# Patient Record
Sex: Male | Born: 1995
Health system: Southern US, Community
[De-identification: ages and names within clinical notes are randomized; demographics above are authoritative.]

## PROBLEM LIST (undated history)

## (undated) DIAGNOSIS — J45909 Unspecified asthma, uncomplicated: Secondary | ICD-10-CM

## (undated) HISTORY — PX: INGUINAL HERNIA REPAIR: SUR1180

---

## 2013-11-02 ENCOUNTER — Emergency Department: Payer: Self-pay | Admitting: Emergency Medicine

## 2014-02-20 ENCOUNTER — Emergency Department: Payer: Self-pay | Admitting: Emergency Medicine

## 2014-07-20 ENCOUNTER — Emergency Department: Payer: Medicaid Other

## 2014-07-20 ENCOUNTER — Encounter: Payer: Self-pay | Admitting: Emergency Medicine

## 2014-07-20 ENCOUNTER — Emergency Department
Admission: EM | Admit: 2014-07-20 | Discharge: 2014-07-20 | Disposition: A | Discharge status: Home / Self Care | Payer: Medicaid Other | Attending: Student | Admitting: Student

## 2014-07-20 DIAGNOSIS — Y998 Other external cause status: Secondary | ICD-10-CM | POA: Insufficient documentation

## 2014-07-20 DIAGNOSIS — S8001XA Contusion of right knee, initial encounter: Secondary | ICD-10-CM | POA: Diagnosis not present

## 2014-07-20 DIAGNOSIS — Z720 Tobacco use: Secondary | ICD-10-CM | POA: Diagnosis not present

## 2014-07-20 DIAGNOSIS — Y9389 Activity, other specified: Secondary | ICD-10-CM | POA: Insufficient documentation

## 2014-07-20 DIAGNOSIS — M25561 Pain in right knee: Secondary | ICD-10-CM

## 2014-07-20 DIAGNOSIS — Y92410 Unspecified street and highway as the place of occurrence of the external cause: Secondary | ICD-10-CM | POA: Insufficient documentation

## 2014-07-20 DIAGNOSIS — S8991XA Unspecified injury of right lower leg, initial encounter: Secondary | ICD-10-CM | POA: Diagnosis present

## 2014-07-20 HISTORY — DX: Unspecified asthma, uncomplicated: J45.909

## 2014-07-20 MED ORDER — KETOROLAC TROMETHAMINE 60 MG/2ML IM SOLN
INTRAMUSCULAR | Status: AC
  Administered 2014-07-20: 60 mg via INTRAMUSCULAR
  Filled 2014-07-20: qty 2

## 2014-07-20 MED ORDER — HYDROCODONE-ACETAMINOPHEN 5-325 MG PO TABS: 2.0000 | ORAL_TABLET | Freq: Four times a day (QID) | ORAL | Status: DC | PRN

## 2014-07-20 MED ORDER — IBUPROFEN 800 MG PO TABS: 800.0000 mg | ORAL_TABLET | Freq: Three times a day (TID) | ORAL | Status: DC | PRN

## 2014-07-20 MED ORDER — KETOROLAC TROMETHAMINE 30 MG/ML IJ SOLN: 60.0000 mg | Freq: Once | INTRAMUSCULAR | Status: AC
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
## 2014-12-01 ENCOUNTER — Encounter: Payer: Self-pay | Admitting: *Deleted

## 2014-12-01 DIAGNOSIS — Z720 Tobacco use: Secondary | ICD-10-CM | POA: Diagnosis not present

## 2014-12-01 DIAGNOSIS — J45909 Unspecified asthma, uncomplicated: Secondary | ICD-10-CM | POA: Diagnosis not present

## 2014-12-01 DIAGNOSIS — J029 Acute pharyngitis, unspecified: Secondary | ICD-10-CM | POA: Diagnosis present

## 2014-12-01 DIAGNOSIS — H9203 Otalgia, bilateral: Secondary | ICD-10-CM | POA: Insufficient documentation

## 2014-12-01 LAB — POCT RAPID STREP A: Streptococcus, Group A Screen (Direct): NEGATIVE

## 2014-12-01 NOTE — ED Notes (Signed)
Pt reports onset of bilateral ear pain and throat pain for about 2 days. Denies fevers.

## 2014-12-02 ENCOUNTER — Emergency Department
Admission: EM | Admit: 2014-12-02 | Discharge: 2014-12-02 | Disposition: A | Discharge status: Home / Self Care | Payer: Medicaid Other | Attending: Emergency Medicine | Admitting: Emergency Medicine

## 2014-12-02 DIAGNOSIS — J029 Acute pharyngitis, unspecified: Secondary | ICD-10-CM

## 2014-12-02 MED ORDER — MAGIC MOUTHWASH: 5.0000 mL | Freq: Three times a day (TID) | ORAL | Status: AC | PRN

## 2014-12-02 MED ORDER — AMOXICILLIN 500 MG PO CAPS: 500.0000 mg | ORAL_CAPSULE | Freq: Three times a day (TID) | ORAL | Status: DC

## 2014-12-02 MED ORDER — AMOXICILLIN 500 MG PO CAPS
500.0000 mg | ORAL_CAPSULE | Freq: Once | ORAL | Status: AC
  Administered 2014-12-02: 500 mg via ORAL
  Filled 2014-12-02: qty 1

## 2014-12-02 MED ORDER — MAGIC MOUTHWASH
10.0000 mL | Freq: Once | ORAL | Status: AC
  Administered 2014-12-02: 10 mL via ORAL
  Filled 2014-12-02: qty 10

## 2014-12-02 NOTE — ED Provider Notes (Signed)
[facility] Emergency Department Provider Note  ____________________________________________  Time seen: Approximately 1:10 AM  I have reviewed the triage vital signs and the nursing notes.   HISTORY  Chief Complaint Sore Throat    HPI Aycen Porreca Pesta is a 19 y.o. male who presents to the ED from home with a 2 day history ofsore throat and bilateral ear pain. Denies sick contacts. Denies fevers, chills, chest pain, shortness of breath, coughing, abdominal pain, nausea, vomiting, diarrhea, headache. Denies drainage from ears. Complains of sharp pain in throat, especially on swallowing. Noted "lump" on right side of neck today.   Past Medical History  Diagnosis Date  . Asthma     There are no active problems to display for this patient.   Past Surgical History  Procedure Laterality Date  . Inguinal hernia repair Right     Current Outpatient Rx  Name  Route  Sig  Dispense  Refill  . HYDROcodone-acetaminophen (NORCO) 5-325 MG per tablet   Oral   Take 2 tablets by mouth every 6 (six) hours as needed for moderate pain.   12 tablet   0   . ibuprofen (ADVIL,MOTRIN) 800 MG tablet   Oral   Take 1 tablet (800 mg total) by mouth every 8 (eight) hours as needed.   30 tablet   0     Allergies Review of patient's allergies indicates no known allergies.  No family history on file.  Social History Social History  Substance Use Topics  . Smoking status: Current Every Day Smoker    Types: Cigarettes  . Smokeless tobacco: Never Used  . Alcohol Use: Yes    Review of Systems Constitutional: No fever/chills Eyes: No visual changes. ENT: Positive sore throat. Positive bilateral ear pain. Cardiovascular: Denies chest pain. Respiratory: Denies shortness of breath. Gastrointestinal: No abdominal pain.  No nausea, no vomiting.  No diarrhea.  No constipation. Genitourinary: Negative for dysuria. Musculoskeletal: Negative for back pain. Skin:  Negative for rash. Neurological: Negative for headaches, focal weakness or numbness.  10-point ROS otherwise negative.  ____________________________________________   PHYSICAL EXAM:  VITAL SIGNS: ED Triage Vitals  Enc Vitals Group     BP 12/01/14 2330 146/92 mmHg     Pulse Rate 12/01/14 2330 72     Resp 12/01/14 2330 16     Temp 12/01/14 2330 98.7 F (37.1 C)     Temp Source 12/01/14 2330 Oral     SpO2 12/01/14 2330 100 %     Weight 12/01/14 2330 160 lb (72.576 kg)     Height 12/01/14 2330 5\' 10"  (1.778 m)     Head Cir --      Peak Flow --      Pain Score 12/01/14 2331 6     Pain Loc --      Pain Edu? --      Excl. in GC? --     Constitutional: Alert and oriented. Well appearing and in no acute distress. Eyes: Conjunctivae are normal. PERRL. EOMI. Head: Atraumatic. Ears: Bilateral TMs with mild fluid; otherwise within normal limits. Nose: No congestion/rhinnorhea. Mouth/Throat: Mucous membranes are moist.  Oropharynx erythematous. There is no tonsillar swelling or peritonsillar abscess. There is no hoarse or muffled voice. There is no drooling. Neck: No stridor.   Hematological/Lymphatic/Immunilogical: Small, mobile, palpable lymph node right anterior cervical neck.. Cardiovascular: Normal rate, regular rhythm. Grossly normal heart sounds.  Good peripheral circulation. Respiratory: Normal respiratory effort.  No retractions. Lungs CTAB. Gastrointestinal: Soft and nontender.  No distention. No abdominal bruits. No CVA tenderness. Musculoskeletal: No lower extremity tenderness nor edema.  No joint effusions. Neurologic:  Normal speech and language. No gross focal neurologic deficits are appreciated. No gait instability. Skin:  Skin is warm, dry and intact. No rash noted. Psychiatric: Mood and affect are normal. Speech and behavior are normal.  ____________________________________________   LABS (all labs ordered are listed, but only abnormal results are  displayed)  Labs Reviewed  CULTURE, GROUP A STREP ([facility] ONLY)  POCT RAPID STREP A   ____________________________________________  EKG  None ____________________________________________  RADIOLOGY  None ____________________________________________   PROCEDURES  Procedure(s) performed: None  Critical Care performed: No  ____________________________________________   INITIAL IMPRESSION / ASSESSMENT AND PLAN / ED COURSE  Pertinent labs & imaging results that were available during my care of the patient were reviewed by me and considered in my medical decision making (see chart for details).  19 year old male who presents with pharyngitis. Despite negative rapid strep test, throat is very erythematous and I will treat empirically with amoxicillin. Magic mouthwash for discomfort. Strict return precautions given. Patient and spouse verbalize understanding and agree with plan of care. ____________________________________________   FINAL CLINICAL IMPRESSION(S) / ED DIAGNOSES  Final diagnoses:  Pharyngitis      Irean Hong, MD 12/02/14 [phone]

## 2014-12-02 NOTE — ED Notes (Signed)
MD at bedside for eval.

## 2014-12-02 NOTE — Discharge Instructions (Signed)
1. Take antibiotic as prescribed (amoxicillin 500 mg 3 times daily 7 days). 2. Use Magic mouthwash as needed for throat discomfort. 3. Return to the ER for worsening symptoms, persistent vomiting, difficulty breathing or other concerns.  Pharyngitis Pharyngitis is redness, pain, and swelling (inflammation) of your pharynx.  CAUSES  Pharyngitis is usually caused by infection. Most of the time, these infections are from viruses (viral) and are part of a cold. However, sometimes pharyngitis is caused by bacteria (bacterial). Pharyngitis can also be caused by allergies. Viral pharyngitis may be spread from person to person by coughing, sneezing, and personal items or utensils (cups, forks, spoons, toothbrushes). Bacterial pharyngitis may be spread from person to person by more intimate contact, such as kissing.  SIGNS AND SYMPTOMS  Symptoms of pharyngitis include:   Sore throat.   Tiredness (fatigue).   Low-grade fever.   Headache.  Joint pain and muscle aches.  Skin rashes.  Swollen lymph nodes.  Plaque-like film on throat or tonsils (often seen with bacterial pharyngitis). DIAGNOSIS  Your health care provider will ask you questions about your illness and your symptoms. Your medical history, along with a physical exam, is often all that is needed to diagnose pharyngitis. Sometimes, a rapid strep test is done. Other lab tests may also be done, depending on the suspected cause.  TREATMENT  Viral pharyngitis will usually get better in 3-4 days without the use of medicine. Bacterial pharyngitis is treated with medicines that kill germs (antibiotics).  HOME CARE INSTRUCTIONS   Drink enough water and fluids to keep your urine clear or pale yellow.   Only take over-the-counter or prescription medicines as directed by your health care provider:   If you are prescribed antibiotics, make sure you finish them even if you start to feel better.   Do not take aspirin.   Get lots of  rest.   Gargle with 8 oz of salt water ( tsp of salt per 1 qt of water) as often as every 1-2 hours to soothe your throat.   Throat lozenges (if you are not at risk for choking) or sprays may be used to soothe your throat. SEEK MEDICAL CARE IF:   You have large, tender lumps in your neck.  You have a rash.  You cough up green, yellow-brown, or bloody spit. SEEK IMMEDIATE MEDICAL CARE IF:   Your neck becomes stiff.  You drool or are unable to swallow liquids.  You vomit or are unable to keep medicines or liquids down.  You have severe pain that does not go away with the use of recommended medicines.  You have trouble breathing (not caused by a stuffy nose). MAKE SURE YOU:   Understand these instructions.  Will watch your condition.  Will get help right away if you are not doing well or get worse. Document Released: 03/04/2005 Document Revised: 12/23/2012 Document Reviewed: 11/09/2012 [facility] Patient Information 2015 [place], [place]. This information is not intended to replace advice given to you by your health care provider. Make sure you discuss any questions you have with your health care provider.

## 2014-12-04 LAB — CULTURE, GROUP A STREP (THRC)

## 2015-07-30 ENCOUNTER — Emergency Department
Admission: EM | Admit: 2015-07-30 | Discharge: 2015-07-30 | Disposition: A | Discharge status: Home / Self Care | Payer: Managed Care, Other (non HMO) | Attending: Emergency Medicine | Admitting: Emergency Medicine

## 2015-07-30 ENCOUNTER — Encounter: Payer: Self-pay | Admitting: Emergency Medicine

## 2015-07-30 ENCOUNTER — Emergency Department: Payer: Managed Care, Other (non HMO)

## 2015-07-30 DIAGNOSIS — Y92009 Unspecified place in unspecified non-institutional (private) residence as the place of occurrence of the external cause: Secondary | ICD-10-CM | POA: Diagnosis not present

## 2015-07-30 DIAGNOSIS — Y939 Activity, unspecified: Secondary | ICD-10-CM | POA: Diagnosis not present

## 2015-07-30 DIAGNOSIS — Z79899 Other long term (current) drug therapy: Secondary | ICD-10-CM | POA: Diagnosis not present

## 2015-07-30 DIAGNOSIS — S63501A Unspecified sprain of right wrist, initial encounter: Secondary | ICD-10-CM | POA: Diagnosis not present

## 2015-07-30 DIAGNOSIS — Y999 Unspecified external cause status: Secondary | ICD-10-CM | POA: Insufficient documentation

## 2015-07-30 DIAGNOSIS — W19XXXA Unspecified fall, initial encounter: Secondary | ICD-10-CM | POA: Insufficient documentation

## 2015-07-30 DIAGNOSIS — M25531 Pain in right wrist: Secondary | ICD-10-CM | POA: Diagnosis present

## 2015-07-30 DIAGNOSIS — F17210 Nicotine dependence, cigarettes, uncomplicated: Secondary | ICD-10-CM | POA: Diagnosis not present

## 2015-07-30 DIAGNOSIS — J45909 Unspecified asthma, uncomplicated: Secondary | ICD-10-CM | POA: Diagnosis not present

## 2015-07-30 MED ORDER — TRAMADOL HCL 50 MG PO TABS: 50.0000 mg | ORAL_TABLET | Freq: Four times a day (QID) | ORAL | Status: AC | PRN

## 2015-07-30 MED ORDER — OXYCODONE-ACETAMINOPHEN 5-325 MG PO TABS
1.0000 | ORAL_TABLET | Freq: Once | ORAL | Status: AC
  Administered 2015-07-30: 1 via ORAL
  Filled 2015-07-30: qty 1

## 2015-07-30 NOTE — ED Provider Notes (Signed)
[facility] Emergency Department Provider Note  ____________________________________________   I have reviewed the triage vital signs and the nursing notes.   HISTORY  Chief Complaint Wrist Pain    HPI Johnny Carter is a 20 y.o. male who has injured his right wrist before presents today complaining of pain to the right wrist after a fall last night. Admits to drinking 'a little'  No chi no other injury non syncopal fall pain is in the dorsum of the hand. No numbness no weakness. No other complaints.     Past Medical History  Diagnosis Date  . Asthma     There are no active problems to display for this patient.   Past Surgical History  Procedure Laterality Date  . Inguinal hernia repair Right     Current Outpatient Rx  Name  Route  Sig  Dispense  Refill  . amoxicillin (AMOXIL) 500 MG capsule   Oral   Take 1 capsule (500 mg total) by mouth 3 (three) times daily.   21 capsule   0   . HYDROcodone-acetaminophen (NORCO) 5-325 MG per tablet   Oral   Take 2 tablets by mouth every 6 (six) hours as needed for moderate pain.   12 tablet   0   . ibuprofen (ADVIL,MOTRIN) 800 MG tablet   Oral   Take 1 tablet (800 mg total) by mouth every 8 (eight) hours as needed.   30 tablet   0   . magic mouthwash SOLN   Oral   Take 5 mLs by mouth 3 (three) times daily as needed for mouth pain.   30 mL   0     Allergies Review of patient's allergies indicates no known allergies.  History reviewed. No pertinent family history.  Social History Social History  Substance Use Topics  . Smoking status: Current Every Day Smoker    Types: Cigarettes  . Smokeless tobacco: Never Used  . Alcohol Use: Yes    Review of Systems See hpi otw neg. ____________________________________________   PHYSICAL EXAM:  VITAL SIGNS: ED Triage Vitals  Enc Vitals Group     BP 07/30/15 0631 129/88 mmHg     Pulse Rate 07/30/15 0631 76     Resp 07/30/15 0631 20      Temp 07/30/15 0631 98.1 F (36.7 C)     Temp Source 07/30/15 0631 Oral     SpO2 07/30/15 0631 99 %     Weight 07/30/15 0631 164 lb (74.39 kg)     Height 07/30/15 0631  (1.803 m)     Head Cir --      Peak Flow --      Pain Score 07/30/15 0632 9     Pain Loc --      Pain Edu? --      Excl. in GC? --     Constitutional: Alert and oriented. Well appearing and in no acute distress. Eyes: Conjunctivae are normal. PERRL. EOMI. Head: Atraumatic. Neck: Nontender with no meningismus Cardiovascular: Normal rate, regular rhythm. Grossly normal heart sounds.  Good peripheral circulation. Respiratory: Normal respiratory effort.  No retractions. Lungs CTAB. Abdominal: Soft and nontender. No distention. No guarding no rebound Back:  There is no focal tenderness or step off there is no midline tenderness there are no lesions noted. there is no CVA tenderness Musculoskeletal: No lower extremity tenderness. No joint effusions, no DVT signs strong distal pulses no edema   There is some mild swelling to the dorsum  of the hand, mild, in the area of the base of the 4th and 5th mcp with no deformity. Has no pain to the wrist joint itself and can flex and extend.  Strong pulses and normal cap refill noted.  Strength and sensation intact in all distributions of the hand.  Can flex and extend wrist and fingers against resistance in all joints.  Compartments soft.   ____________________________________________   LABS (all labs ordered are listed, but only abnormal results are displayed)  Labs Reviewed - No data to display ____________________________________________  EKG  I personally interpreted any EKGs ordered by me or triage  ____________________________________________  RADIOLOGY  I reviewed any imaging ordered by me or triage that were performed during my shift and, if possible, patient and/or family made aware of any abnormal  findings. ____________________________________________   PROCEDURES  Procedure(s) performed: None  Critical Care performed: None  ____________________________________________   INITIAL IMPRESSION / ASSESSMENT AND PLAN / ED COURSE  Pertinent labs & imaging results that were available during my care of the patient were reviewed by me and considered in my medical decision making (see chart for details).  No tenderness over hamate where x ray reads possible injury. Will place pt in splint and advise close f/u with ortho.  ____________________________________________   FINAL CLINICAL IMPRESSION(S) / ED DIAGNOSES  Final diagnoses:  None      This chart was dictated using voice recognition software.  Despite best efforts to proofread,  errors can occur which can change meaning.     Jeanmarie PlantJames A Kaileen Bronkema, MD 07/30/15 [phone]
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
## 2015-07-30 NOTE — Discharge Instructions (Signed)
I don't see any evidence of a fracture where you have your pain. However this does not mean there can not be a hidden fracture or ligamentous injury which could require orthopedic attention. For this reason we stressed the need to follow closely with orthopedic surgery in the next few days, and to return to the emergency room if you have increased pain and swelling or any other new or worrisome symptoms.

## 2015-08-10 ENCOUNTER — Other Ambulatory Visit: Payer: Self-pay | Admitting: Orthopedic Surgery

## 2015-08-10 DIAGNOSIS — S62101A Fracture of unspecified carpal bone, right wrist, initial encounter for closed fracture: Secondary | ICD-10-CM

## 2015-08-18 ENCOUNTER — Ambulatory Visit: Payer: Managed Care, Other (non HMO) | Attending: Orthopedic Surgery

## 2016-03-19 ENCOUNTER — Encounter: Payer: Self-pay | Admitting: Emergency Medicine

## 2016-03-19 ENCOUNTER — Emergency Department
Admission: EM | Admit: 2016-03-19 | Discharge: 2016-03-19 | Disposition: A | Discharge status: Home / Self Care | Payer: Commercial Managed Care - PPO | Attending: Emergency Medicine | Admitting: Emergency Medicine

## 2016-03-19 DIAGNOSIS — F17210 Nicotine dependence, cigarettes, uncomplicated: Secondary | ICD-10-CM | POA: Insufficient documentation

## 2016-03-19 DIAGNOSIS — Z79899 Other long term (current) drug therapy: Secondary | ICD-10-CM | POA: Diagnosis not present

## 2016-03-19 DIAGNOSIS — R112 Nausea with vomiting, unspecified: Secondary | ICD-10-CM | POA: Diagnosis not present

## 2016-03-19 DIAGNOSIS — J029 Acute pharyngitis, unspecified: Secondary | ICD-10-CM | POA: Insufficient documentation

## 2016-03-19 DIAGNOSIS — J45909 Unspecified asthma, uncomplicated: Secondary | ICD-10-CM | POA: Insufficient documentation

## 2016-03-19 LAB — POCT RAPID STREP A: STREPTOCOCCUS, GROUP A SCREEN (DIRECT): NEGATIVE

## 2016-03-19 MED ORDER — GI COCKTAIL ~~LOC~~
30.0000 mL | Freq: Once | ORAL | Status: AC
  Administered 2016-03-19: 30 mL via ORAL
  Filled 2016-03-19: qty 30

## 2016-03-19 MED ORDER — BENZONATATE 100 MG PO CAPS: ORAL_CAPSULE | ORAL | 0 refills | Status: DC

## 2016-03-19 NOTE — ED Provider Notes (Signed)
[facility] Emergency Department Provider Note  ____________________________________________   First MD Initiated Contact with Patient 03/19/16 0401     (approximate)  I have reviewed the triage vital signs and the nursing notes.   HISTORY  Chief Complaint Sore Throat    HPI Johnny Carter is a 21 y.o. male without any chronic medical conditions who presents for evaluation of severe burning sore throat.  Reports he drank alcohol heavily for New Year's Eve and then vomited four times on New Year's Day.  Since then his throat has been very sore.  Denies fever/chills, CP, SOB, abd pain.  No more N/V, and in fact he is very hungry but feels like he cannot swallow due to the pain.  No difficulty breathing, no muffled voice, handling his secretions without difficulty.     Past Medical History:  Diagnosis Date  . Asthma     There are no active problems to display for this patient.   Past Surgical History:  Procedure Laterality Date  . INGUINAL HERNIA REPAIR Right     Prior to Admission medications   Medication Sig Start Date End Date Taking? Authorizing Provider  amoxicillin (AMOXIL) 500 MG capsule Take 1 capsule (500 mg total) by mouth 3 (three) times daily. 12/02/14   Irean Hong, MD  benzonatate (TESSALON PERLES) 100 MG capsule Allow 1 capsule to dissolve on the back of your tongue to help soothe your throat as needed every 6 hours 03/19/16   Loleta Rose, MD  ibuprofen (ADVIL,MOTRIN) 800 MG tablet Take 1 tablet (800 mg total) by mouth every 8 (eight) hours as needed. 07/20/14   Evangeline Dakin, PA-C  magic mouthwash SOLN Take 5 mLs by mouth 3 (three) times daily as needed for mouth pain. 12/02/14   Irean Hong, MD  traMADol (ULTRAM) 50 MG tablet Take 1 tablet (50 mg total) by mouth every 6 (six) hours as needed. 07/30/15 07/29/16  Jeanmarie Plant, MD    Allergies Patient has no known allergies.  No family history on file.  Social History Social  History  Substance Use Topics  . Smoking status: Current Every Day Smoker    Types: Cigarettes  . Smokeless tobacco: Never Used  . Alcohol use Yes    Review of Systems Constitutional: No fever/chills Eyes: No visual changes. ENT: +sore throat. Cardiovascular: Denies chest pain. Respiratory: Denies shortness of breath. Gastrointestinal: No abdominal pain.  Vomiting x 4 yesterday, now resolved.  No diarrhea.  No constipation. Genitourinary: Negative for dysuria. Musculoskeletal: Negative for back pain. Skin: Negative for rash. Neurological: Negative for headaches, focal weakness or numbness.  10-point ROS otherwise negative.  ____________________________________________   PHYSICAL EXAM:  VITAL SIGNS: ED Triage Vitals [03/19/16 0303]  Enc Vitals Group     BP (!) 168/99     Pulse Rate (!) 110     Resp 18     Temp 99.2 F (37.3 C)     Temp Source Oral     SpO2 98 %     Weight 175 lb (79.4 kg)     Height 5\' 11"  (1.803 m)     Head Circumference      Peak Flow      Pain Score 6     Pain Loc      Pain Edu?      Excl. in GC?     Constitutional: Alert and oriented. Well appearing and in no acute distress. Eyes: Conjunctivae are normal. PERRL. EOMI. Head:  Atraumatic. Nose: No congestion/rhinnorhea. Mouth/Throat: Mucous membranes are moist.  Oropharynx erythematous without exudate nor petechiae Neck: No stridor.  No meningeal signs.  No lymphadenopathy, swelling, nor crepitus Cardiovascular: Normal rate, regular rhythm. Good peripheral circulation. Grossly normal heart sounds. Respiratory: Normal respiratory effort.  No retractions. Lungs CTAB. Gastrointestinal: Soft and nontender. No distention.  Musculoskeletal: No lower extremity tenderness nor edema. No gross deformities of extremities. Neurologic:  Normal speech and language. No gross focal neurologic deficits are appreciated.  Skin:  Skin is warm, dry and intact. No rash noted. Psychiatric: Mood and affect are  normal. Speech and behavior are normal.  ____________________________________________   LABS (all labs ordered are listed, but only abnormal results are displayed)  Labs Reviewed  CULTURE, GROUP A STREP [facility]  POCT RAPID STREP A   ____________________________________________  EKG  None - EKG not ordered by ED physician ____________________________________________  RADIOLOGY   No results found.  ____________________________________________   PROCEDURES  Procedure(s) performed:   Procedures   Critical Care performed: No ____________________________________________   INITIAL IMPRESSION / ASSESSMENT AND PLAN / ED COURSE  Pertinent labs & imaging results that were available during my care of the patient were reviewed by me and considered in my medical decision making (see chart for details).  Sore throat after several episodes of emesis.  Well-appearing, NAD, watching TV in no apparent discomfort.  No crepitus or other concerning findings on exam to suggest Boerhaave's.  I will provide a GI cocktail to see if this helps him feel better and display prescribing Tessalon for him to dissolve in his mouth and help coat the back of his throat.   Clinical Course as of Mar 19 644  Tue Mar 19, 2016  [id number] Patient feels better after GI cocktail, tolerating PO intake.  Discharged as per plan.  [CF]    Clinical Course User Index [CF] Loleta Roseory Ardyce Heyer, MD    ____________________________________________  FINAL CLINICAL IMPRESSION(S) / ED DIAGNOSES  Final diagnoses:  Sore throat  Non-intractable vomiting with nausea, unspecified vomiting type     MEDICATIONS GIVEN DURING THIS VISIT:  Medications  gi cocktail (Maalox,Lidocaine,Donnatal) (30 mLs Oral Given 03/19/16 0523)     NEW OUTPATIENT MEDICATIONS STARTED DURING THIS VISIT:  Discharge Medication List as of 03/19/2016  5:50 AM    START taking these medications   Details  benzonatate (TESSALON PERLES) 100 MG capsule  Allow 1 capsule to dissolve on the back of your tongue to help soothe your throat as needed every 6 hours, Print        Discharge Medication List as of 03/19/2016  5:50 AM      Discharge Medication List as of 03/19/2016  5:50 AM       Note:  This document was prepared using Dragon voice recognition software and may include unintentional dictation errors.    Loleta Roseory Evelisse Szalkowski, MD 03/19/16 [phone]

## 2016-03-19 NOTE — ED Notes (Signed)
Pt reports "i feel better now."

## 2016-03-19 NOTE — Discharge Instructions (Signed)
We believe that you caused irritation and inflammation of your throat due to your multiple episodes of vomiting, but based on your evaluation and workup there does not seem to be any acute or emergent medical condition at this time.  Please try using the prescribed medication as well as over-the-counter ibuprofen and Tylenol as needed for pain control.  Drink plenty of cool liquids to help soothe the throat.  It should improve rapidly over the next couple of days.  Return to the emergency department if he develop new or worsening symptoms that concern you.

## 2016-03-19 NOTE — ED Triage Notes (Signed)
Pt presents to ED with sore throat since Monday morning. Pt states he vomited 4X Monday morning and has a sore throat since then. Pt reports he has been unable to eat today. Pt able to answer questions without difficulty. No increased work of breathing noted at this time.

## 2016-03-21 LAB — CULTURE, GROUP A STREP (THRC)

## 2016-10-14 ENCOUNTER — Encounter: Payer: Self-pay | Admitting: *Deleted

## 2016-10-14 DIAGNOSIS — R1110 Vomiting, unspecified: Secondary | ICD-10-CM | POA: Diagnosis present

## 2016-10-14 DIAGNOSIS — Z79899 Other long term (current) drug therapy: Secondary | ICD-10-CM | POA: Insufficient documentation

## 2016-10-14 DIAGNOSIS — R809 Proteinuria, unspecified: Secondary | ICD-10-CM | POA: Diagnosis not present

## 2016-10-14 DIAGNOSIS — F17210 Nicotine dependence, cigarettes, uncomplicated: Secondary | ICD-10-CM | POA: Diagnosis not present

## 2016-10-14 DIAGNOSIS — R112 Nausea with vomiting, unspecified: Secondary | ICD-10-CM | POA: Diagnosis not present

## 2016-10-14 DIAGNOSIS — D72829 Elevated white blood cell count, unspecified: Secondary | ICD-10-CM | POA: Diagnosis not present

## 2016-10-14 DIAGNOSIS — J45909 Unspecified asthma, uncomplicated: Secondary | ICD-10-CM | POA: Diagnosis not present

## 2016-10-14 LAB — URINALYSIS, COMPLETE (UACMP) WITH MICROSCOPIC
Bacteria, UA: NONE SEEN
Bilirubin Urine: NEGATIVE
Glucose, UA: NEGATIVE mg/dL
Hgb urine dipstick: NEGATIVE
KETONES UR: 80 mg/dL — AB
Leukocytes, UA: NEGATIVE
Nitrite: NEGATIVE
PH: 5 (ref 5.0–8.0)
PROTEIN: 30 mg/dL — AB
Specific Gravity, Urine: 1.029 (ref 1.005–1.030)

## 2016-10-14 LAB — COMPREHENSIVE METABOLIC PANEL
ALK PHOS: 77 U/L (ref 38–126)
ALT: 26 U/L (ref 17–63)
ANION GAP: 11 (ref 5–15)
AST: 23 U/L (ref 15–41)
Albumin: 4.3 g/dL (ref 3.5–5.0)
BILIRUBIN TOTAL: 0.8 mg/dL (ref 0.3–1.2)
BUN: 9 mg/dL (ref 6–20)
CHLORIDE: 106 mmol/L (ref 101–111)
CO2: 24 mmol/L (ref 22–32)
CREATININE: 1.01 mg/dL (ref 0.61–1.24)
Calcium: 9.3 mg/dL (ref 8.9–10.3)
Glucose, Bld: 98 mg/dL (ref 65–99)
Potassium: 3.5 mmol/L (ref 3.5–5.1)
Sodium: 141 mmol/L (ref 135–145)
Total Protein: 7.3 g/dL (ref 6.5–8.1)

## 2016-10-14 LAB — LIPASE, BLOOD: Lipase: 23 U/L (ref 11–51)

## 2016-10-14 LAB — CBC
HCT: 49.5 % (ref 40.0–52.0)
Hemoglobin: 17 g/dL (ref 13.0–18.0)
MCH: 31.6 pg (ref 26.0–34.0)
MCHC: 34.4 g/dL (ref 32.0–36.0)
MCV: 91.8 fL (ref 80.0–100.0)
PLATELETS: 303 10*3/uL (ref 150–440)
RBC: 5.39 MIL/uL (ref 4.40–5.90)
RDW: 13.4 % (ref 11.5–14.5)
WBC: 14.9 10*3/uL — ABNORMAL HIGH (ref 3.8–10.6)

## 2016-10-14 NOTE — ED Triage Notes (Addendum)
Pt ambulatory to triage. Pt has abd pain with vomiting today. No diarrhea.  Vomiting x 5 today.   Pt donates plasma twice a week x 1 year.  Pt states I think im dehydrated.  Last donated was 3 days ago. Pt alert.

## 2016-10-15 ENCOUNTER — Emergency Department
Admission: EM | Admit: 2016-10-15 | Discharge: 2016-10-15 | Disposition: A | Discharge status: Home / Self Care | Payer: Commercial Managed Care - PPO | Attending: Emergency Medicine | Admitting: Emergency Medicine

## 2016-10-15 DIAGNOSIS — D72829 Elevated white blood cell count, unspecified: Secondary | ICD-10-CM

## 2016-10-15 DIAGNOSIS — R112 Nausea with vomiting, unspecified: Secondary | ICD-10-CM

## 2016-10-15 DIAGNOSIS — R809 Proteinuria, unspecified: Secondary | ICD-10-CM

## 2016-10-15 LAB — CK: Total CK: 205 U/L (ref 49–397)

## 2016-10-15 MED ORDER — ONDANSETRON 4 MG PO TBDP: 4.0000 mg | ORAL_TABLET | Freq: Three times a day (TID) | ORAL | 0 refills | Status: DC | PRN

## 2016-10-15 MED ORDER — SODIUM CHLORIDE 0.9 % IV BOLUS (SEPSIS)
1000.0000 mL | Freq: Once | INTRAVENOUS | Status: AC
  Administered 2016-10-15: 1000 mL via INTRAVENOUS

## 2016-10-15 MED ORDER — ONDANSETRON HCL 4 MG/2ML IJ SOLN
4.0000 mg | Freq: Once | INTRAMUSCULAR | Status: DC
  Filled 2016-10-15: qty 2

## 2016-10-15 NOTE — ED Provider Notes (Signed)
[facility] Emergency Department Provider Note  ____________________________________________   None    (approximate)  I have reviewed the triage vital signs and the nursing notes.   HISTORY  Chief Complaint Abdominal Pain   HPI Johnny Carter is a 21 y.o. male who presents to the emergency department for treatment and evaluation of vomiting. Patient states he has had 5 episodes of vomiting today and has been unable to keep any food or fluids down. Patient states that he believes he is "dehydrated" because he has donated plasma twice per week for approximately a year. The last time he donated the [facility] was about 3 days ago. He has had no known sick contacts. He has not attempted any alleviating measures for this complaint.   Past Medical History:  Diagnosis Date  . Asthma     There are no active problems to display for this patient.   Past Surgical History:  Procedure Laterality Date  . INGUINAL HERNIA REPAIR Right     Prior to Admission medications   Medication Sig Start Date End Date Taking? Authorizing Provider  amoxicillin (AMOXIL) 500 MG capsule Take 1 capsule (500 mg total) by mouth 3 (three) times daily. 12/02/14   Irean HongSung, Jade J, MD  benzonatate (TESSALON PERLES) 100 MG capsule Allow 1 capsule to dissolve on the back of your tongue to help soothe your throat as needed every 6 hours 03/19/16   Loleta RoseForbach, Cory, MD  ibuprofen (ADVIL,MOTRIN) 800 MG tablet Take 1 tablet (800 mg total) by mouth every 8 (eight) hours as needed. 07/20/14   Beers, Charmayne Sheerharles M, PA-C  magic mouthwash SOLN Take 5 mLs by mouth 3 (three) times daily as needed for mouth pain. 12/02/14   Irean HongSung, Jade J, MD  ondansetron (ZOFRAN-ODT) 4 MG disintegrating tablet Take 1 tablet (4 mg total) by mouth every 8 (eight) hours as needed for nausea or vomiting. 10/15/16   Kem Boroughsriplett, Makell Drohan B, FNP    Allergies Patient has no known allergies.  No family history on file.  Social History Social  History  Substance Use Topics  . Smoking status: Current Every Day Smoker    Types: Cigarettes  . Smokeless tobacco: Never Used  . Alcohol use Yes    Review of Systems  Constitutional: No fever/chills Eyes: No visual changes. ENT: No sore throat. Cardiovascular: Denies chest pain. Respiratory: Denies shortness of breath. Gastrointestinal: No abdominal pain.  Positive for nausea and vomiting without diarrhea.  Genitourinary: Negative for dysuria. Musculoskeletal: Negative for back pain. Skin: Negative for rash. Neurological: Negative for headaches, focal weakness or numbness. ____________________________________________   PHYSICAL EXAM:  VITAL SIGNS: ED Triage Vitals  Enc Vitals Group     BP 10/14/16 2315 (!) 160/103     Pulse Rate 10/14/16 2315 87     Resp 10/14/16 2315 20     Temp 10/14/16 2315 99.8 F (37.7 C)     Temp Source 10/14/16 2315 Oral     SpO2 10/14/16 2315 99 %     Weight 10/14/16 2312 183 lb (83 kg)     Height 10/14/16 2312 5\' 11"  (1.803 m)     Head Circumference --      Peak Flow --      Pain Score 10/14/16 2312 5     Pain Loc --      Pain Edu? --      Excl. in GC? --     Constitutional: Alert and oriented. Well appearing and in no acute distress. Eyes:  Conjunctivae are normal. Head: Atraumatic. Nose: No congestion/rhinnorhea. Mouth/Throat: Mucous membranes are moist.  Oropharynx non-erythematous. Neck: No stridor.   Cardiovascular: Normal rate, regular rhythm. Grossly normal heart sounds.  Good peripheral circulation. Respiratory: Normal respiratory effort.  No retractions. Lungs CTAB. Gastrointestinal: Soft and nontender. No distention. No abdominal bruits. No CVA tenderness. Musculoskeletal: No lower extremity tenderness nor edema.  No joint effusions. Neurologic:  Normal speech and language. No gross focal neurologic deficits are appreciated. No gait instability. Skin:  Skin is warm, dry and intact. No rash noted. Psychiatric: Mood and  affect are normal. Speech and behavior are normal.  ____________________________________________   LABS (all labs ordered are listed, but only abnormal results are displayed)  Labs Reviewed  CBC - Abnormal; Notable for the following:       Result Value   WBC 14.9 (*)    All other components within normal limits  URINALYSIS, COMPLETE (UACMP) WITH MICROSCOPIC - Abnormal; Notable for the following:    Color, Urine YELLOW (*)    APPearance HAZY (*)    Ketones, ur 80 (*)    Protein, ur 30 (*)    Squamous Epithelial / LPF 0-5 (*)    All other components within normal limits  LIPASE, BLOOD  COMPREHENSIVE METABOLIC PANEL  CK   ____________________________________________  EKG  Not indicated ____________________________________________  RADIOLOGY  No results found.  ____________________________________________   PROCEDURES  Procedure(s) performed: None  Procedures  Critical Care performed: No  ____________________________________________   INITIAL IMPRESSION / ASSESSMENT AND PLAN / ED COURSE  Pertinent labs & imaging results that were available during my care of the patient were reviewed by me and considered in my medical decision making (see chart for details).  21 year old male presenting to the emergency department for treatment and evaluation of nausea and vomiting. Physical exam is essentially benign. Abdomen is soft and nontender. We will start with a bolus of normal saline and give Zofran and then a by mouth challenge. Patient is noted to be hypertensive, but is asymptomatic.  ----------------------------------------- 2:55 AM on 10/15/2016 -----------------------------------------  No further vomiting and he is no longer nauseated. PO challenge requested. If no further vomiting he will be discharged home with zofran prescription. He was advised to [facility] 1 week for repeat urinalysis and CBC. He is to return to the ER for symptoms  that change or worsen if unable to schedule an appointment.  Clinical Course as of Oct 16 302  Tue Oct 15, 2016  0104 Protein: (!) 30 [CT]    Clinical Course User Index [CT] Kingstin Heims B, FNP     ____________________________________________   FINAL CLINICAL IMPRESSION(S) / ED DIAGNOSES  Final diagnoses:  Leukocytosis, unspecified type  Non-intractable vomiting with nausea, unspecified vomiting type  Proteinuria, unspecified type      NEW MEDICATIONS STARTED DURING THIS VISIT:  New Prescriptions   ONDANSETRON (ZOFRAN-ODT) 4 MG DISINTEGRATING TABLET    Take 1 tablet (4 mg total) by mouth every 8 (eight) hours as needed for nausea or vomiting.     Note:  This document was prepared using Dragon voice recognition software and may include unintentional dictation errors.    Chinita Pesterriplett, Shavon Zenz B, FNP 10/15/16 0304    Darci CurrentBrown, Assaria N, MD 10/15/16 [phone]

## 2016-10-15 NOTE — Discharge Instructions (Signed)
Please follow up with the primary care provider of her choice in one week for a repeat urinalysis and repeat CBC. Please stop donating plasma until you are cleared to do so by primary care. Return to the emergency department for any symptom that changes, worsens, or for new concerns if you are unable to see primary care.

## 2017-04-03 ENCOUNTER — Other Ambulatory Visit: Payer: Self-pay

## 2017-04-03 ENCOUNTER — Emergency Department
Admission: EM | Admit: 2017-04-03 | Discharge: 2017-04-03 | Disposition: A | Discharge status: Home / Self Care | Payer: Commercial Managed Care - PPO | Attending: Emergency Medicine | Admitting: Emergency Medicine

## 2017-04-03 DIAGNOSIS — J45909 Unspecified asthma, uncomplicated: Secondary | ICD-10-CM | POA: Insufficient documentation

## 2017-04-03 DIAGNOSIS — K0889 Other specified disorders of teeth and supporting structures: Secondary | ICD-10-CM

## 2017-04-03 DIAGNOSIS — F17210 Nicotine dependence, cigarettes, uncomplicated: Secondary | ICD-10-CM | POA: Diagnosis not present

## 2017-04-03 DIAGNOSIS — Z79899 Other long term (current) drug therapy: Secondary | ICD-10-CM | POA: Insufficient documentation

## 2017-04-03 DIAGNOSIS — R0981 Nasal congestion: Secondary | ICD-10-CM | POA: Insufficient documentation

## 2017-04-03 DIAGNOSIS — H9203 Otalgia, bilateral: Secondary | ICD-10-CM | POA: Diagnosis not present

## 2017-04-03 MED ORDER — FEXOFENADINE-PSEUDOEPHED ER 60-120 MG PO TB12: 1.0000 | ORAL_TABLET | Freq: Two times a day (BID) | ORAL | 0 refills | Status: DC

## 2017-04-03 MED ORDER — IBUPROFEN 600 MG PO TABS: 600.0000 mg | ORAL_TABLET | Freq: Four times a day (QID) | ORAL | 0 refills | Status: DC | PRN

## 2017-04-03 MED ORDER — AMOXICILLIN 500 MG PO CAPS: 500.0000 mg | ORAL_CAPSULE | Freq: Three times a day (TID) | ORAL | 0 refills | Status: DC

## 2017-04-03 MED ORDER — TRAMADOL HCL 50 MG PO TABS: 50.0000 mg | ORAL_TABLET | Freq: Four times a day (QID) | ORAL | 0 refills | Status: AC | PRN

## 2017-04-03 NOTE — ED Provider Notes (Signed)
[facility] Emergency Department Provider Note   ____________________________________________   First MD Initiated Contact with Patient 04/03/17 1323     (approximate)  I have reviewed the triage vital signs and the nursing notes.   HISTORY  Chief Complaint Otalgia    HPI Johnny Carter is a 22 y.o. male patient complained of bilateral ear pain, sinus congestion, dental pain for 1 week.  Patient states he is scheduled to see a dentist early part of next month.  Patient rates his pain as a 6/10.  Patient described the pain as "achy".  No pelvis measured for complaint.  Past Medical History:  Diagnosis Date  . Asthma     There are no active problems to display for this patient.   Past Surgical History:  Procedure Laterality Date  . INGUINAL HERNIA REPAIR Right     Prior to Admission medications   Medication Sig Start Date End Date Taking? Authorizing Provider  amoxicillin (AMOXIL) 500 MG capsule Take 1 capsule (500 mg total) by mouth 3 (three) times daily. 12/02/14   Irean HongSung, Jade J, MD  amoxicillin (AMOXIL) 500 MG capsule Take 1 capsule (500 mg total) by mouth 3 (three) times daily. 04/03/17   Joni ReiningSmith, Jovita Persing K, PA-C  benzonatate (TESSALON PERLES) 100 MG capsule Allow 1 capsule to dissolve on the back of your tongue to help soothe your throat as needed every 6 hours 03/19/16   Loleta RoseForbach, Cory, MD  fexofenadine-pseudoephedrine (ALLEGRA-D) 60-120 MG 12 hr tablet Take 1 tablet by mouth 2 (two) times daily. 04/03/17   Joni ReiningSmith, Yarisbel Miranda K, PA-C  ibuprofen (ADVIL,MOTRIN) 600 MG tablet Take 1 tablet (600 mg total) by mouth every 6 (six) hours as needed. 04/03/17   Joni ReiningSmith, Mariona Scholes K, PA-C  ibuprofen (ADVIL,MOTRIN) 800 MG tablet Take 1 tablet (800 mg total) by mouth every 8 (eight) hours as needed. 07/20/14   Beers, Charmayne Sheerharles M, PA-C  magic mouthwash SOLN Take 5 mLs by mouth 3 (three) times daily as needed for mouth pain. 12/02/14   Irean HongSung, Jade J, MD  ondansetron (ZOFRAN-ODT) 4  MG disintegrating tablet Take 1 tablet (4 mg total) by mouth every 8 (eight) hours as needed for nausea or vomiting. 10/15/16   Triplett, Cari B, FNP  traMADol (ULTRAM) 50 MG tablet Take 1 tablet (50 mg total) by mouth every 6 (six) hours as needed. 04/03/17 04/03/18  Joni ReiningSmith, Sharlot Sturkey K, PA-C    Allergies Patient has no known allergies.  No family history on file.  Social History Social History   Tobacco Use  . Smoking status: Current Every Day Smoker    Types: Cigarettes  . Smokeless tobacco: Never Used  Substance Use Topics  . Alcohol use: Yes  . Drug use: No    Review of Systems Constitutional: No fever/chills Eyes: No visual changes. ENT: Sinus congestion, ear pain, and dental pain.   Cardiovascular: Denies chest pain. Respiratory: Denies shortness of breath. Gastrointestinal: No abdominal pain.  No nausea, no vomiting.  No diarrhea.  No constipation. Genitourinary: Negative for dysuria. Musculoskeletal: Negative for back pain. Skin: Negative for rash. Neurological: Negative for headaches, focal weakness or numbness.   ____________________________________________   PHYSICAL EXAM:  VITAL SIGNS: ED Triage Vitals  Enc Vitals Group     BP 04/03/17 1252 (!) 158/93     Pulse Rate 04/03/17 1252 86     Resp 04/03/17 1252 16     Temp 04/03/17 1252 98 F (36.7 C)     Temp Source 04/03/17 1252 Oral  SpO2 04/03/17 1252 99 %     Weight 04/03/17 1252 167 lb (75.8 kg)     Height 04/03/17 1252 5\' 11"  (1.803 m)     Head Circumference --      Peak Flow --      Pain Score 04/03/17 1251 6     Pain Loc --      Pain Edu? --      Excl. in GC? --    Constitutional: Alert and oriented. Well appearing and in no acute distress. Nose: Bilateral maxillary guarding and edematous nasal turbinates. Mouth/Throat: Mucous membranes are moist.  Oropharynx non-erythematous.  Fractured tooth #2 and 15.   Neck: No stridor.  Hematological/Lymphatic/Immunilogical: No cervical  lymphadenopathy. Cardiovascular: Normal rate, regular rhythm. Grossly normal heart sounds.  Good peripheral circulation.  Elevated blood pressure Respiratory: Normal respiratory effort.  No retractions. Lungs CTAB. Skin:  Skin is warm, dry and intact. No rash noted. Psychiatric: Mood and affect are normal. Speech and behavior are normal.  ____________________________________________   LABS (all labs ordered are listed, but only abnormal results are displayed)  Labs Reviewed - No data to display ____________________________________________  EKG   ____________________________________________  RADIOLOGY  No results found.  ____________________________________________   PROCEDURES  Procedure(s) performed: None  Procedures  Critical Care performed: No  ____________________________________________   INITIAL IMPRESSION / ASSESSMENT AND PLAN / ED COURSE  As part of my medical decision making, I reviewed the following data within the electronic MEDICAL RECORD NUMBER    Nasal congestion, eustachian tube dysfunction, and dental pain.  Patient given discharge care instruction advised to follow-up with scheduled dental appointment.      ____________________________________________   FINAL CLINICAL IMPRESSION(S) / ED DIAGNOSES  Final diagnoses:  Pain, dental  Otalgia of both ears  Sinus congestion     ED Discharge Orders        Ordered    amoxicillin (AMOXIL) 500 MG capsule  3 times daily     04/03/17 1327    traMADol (ULTRAM) 50 MG tablet  Every 6 hours PRN     04/03/17 1327    fexofenadine-pseudoephedrine (ALLEGRA-D) 60-120 MG 12 hr tablet  2 times daily     04/03/17 1327    ibuprofen (ADVIL,MOTRIN) 600 MG tablet  Every 6 hours PRN     04/03/17 1327       Note:  This document was prepared using Dragon voice recognition software and may include unintentional dictation errors.    Joni Reining, PA-C 04/03/17 1349    Emily Filbert, MD 04/03/17  [phone]

## 2017-04-03 NOTE — Discharge Instructions (Signed)
Follow-up with scheduled dental appointment. °

## 2017-04-03 NOTE — ED Triage Notes (Signed)
Pt c/o BL ear pain with sinus congestion for the past week..Marland Kitchen

## 2017-04-03 NOTE — ED Notes (Signed)
See triage note. Presents with pain to both ears and some sinus congestion/pressure  Afebrile on arrival

## 2018-01-09 IMAGING — DX DG WRIST COMPLETE 3+V*R*
4 series · 4 of 4 positions shown · non-contrast
Comparison: None.

CLINICAL DATA: Fell onto outstretched hand

EXAM:
RIGHT WRIST - COMPLETE 3+ VIEW

[wrist ap (1 of 2)]
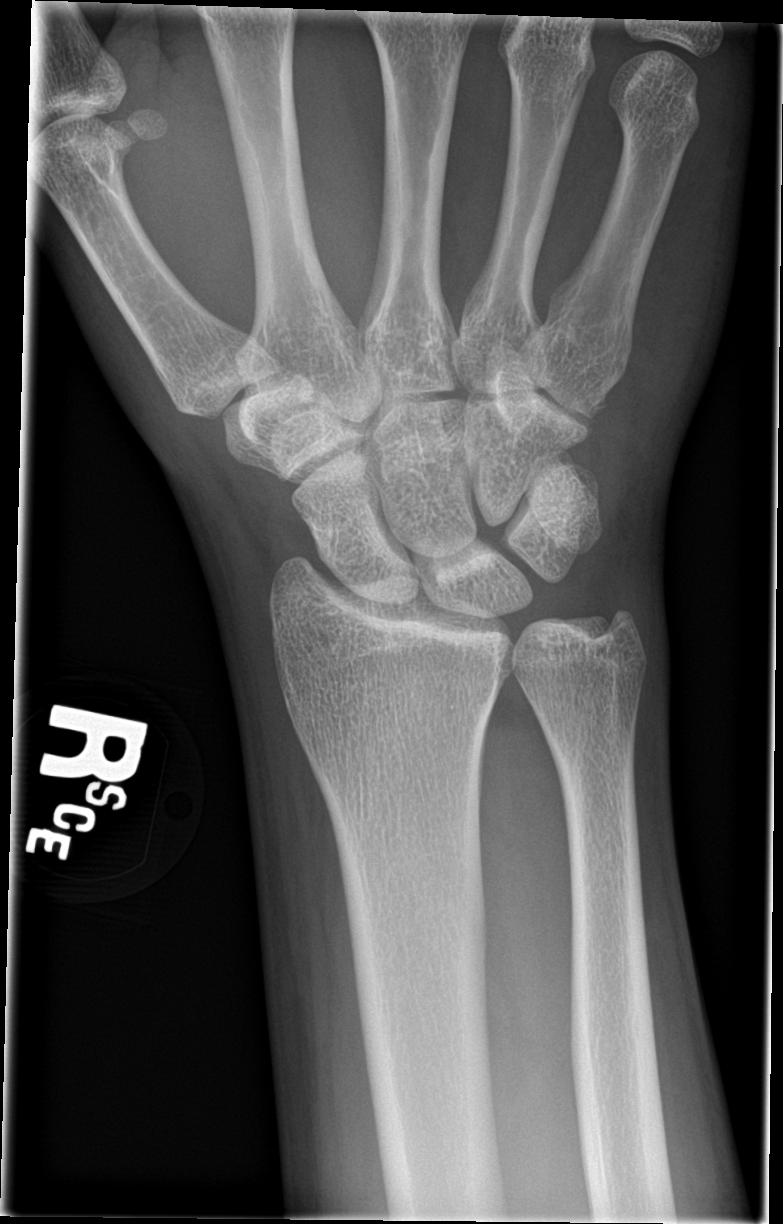

[wrist obl]
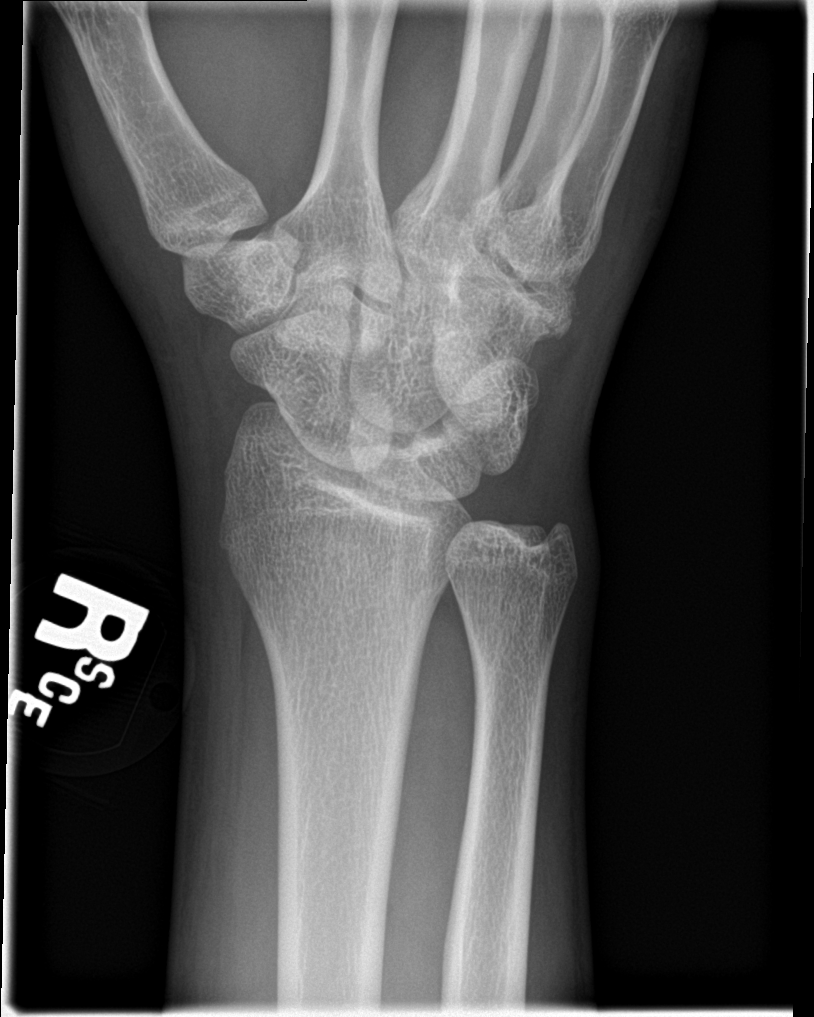

[wrist lat]
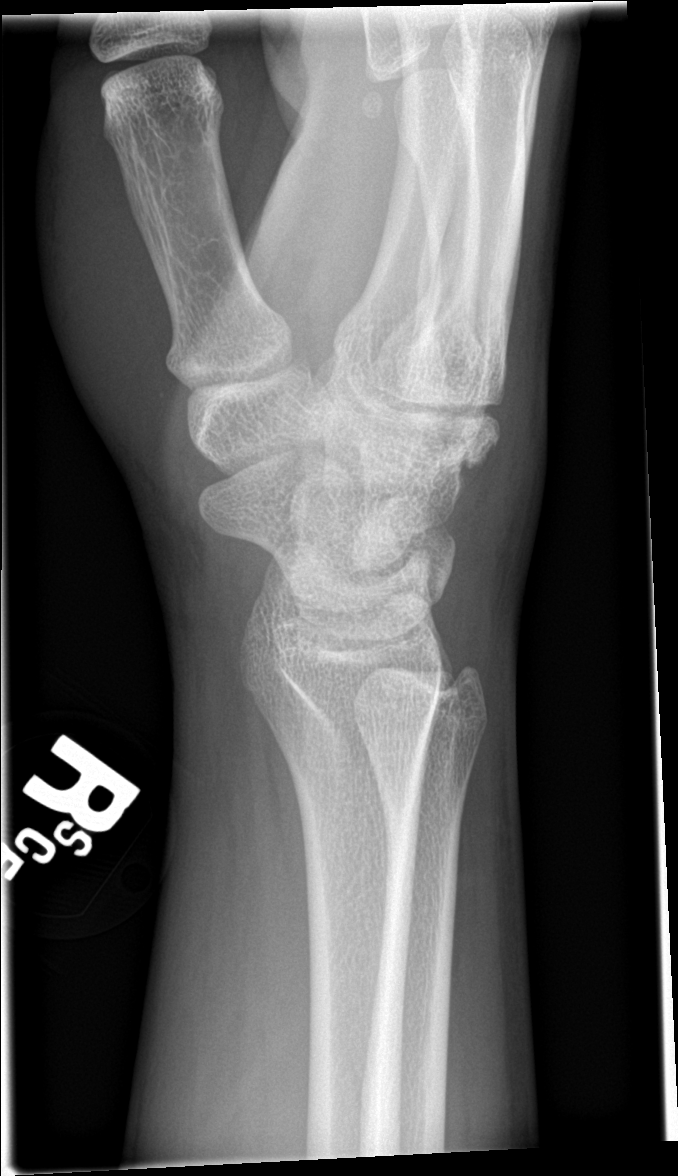

[wrist ap (2 of 2)]
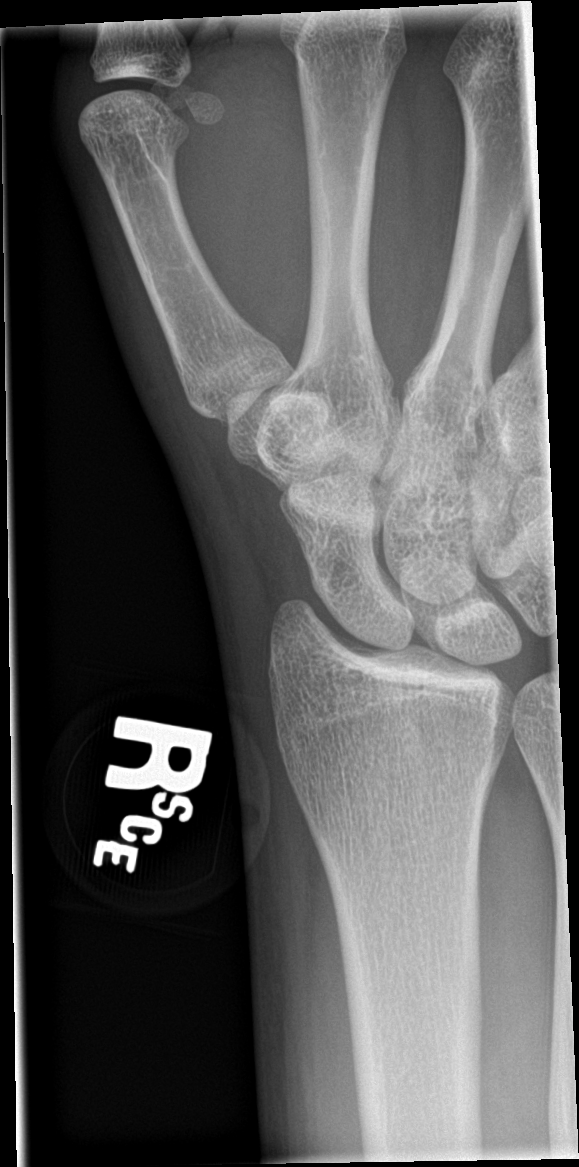

[4 of 4 positions shown; findings below may reference images not displayed]

FINDINGS: There is irregularity at the dorsal aspect of the distal carpal row,
probably in the region of the fifth carpometacarpal articulation.
This is suspicious for nondisplaced fracture but seen on only one
view. There are no other areas of suspicion for fracture. There is
no dislocation. There is no radiopaque foreign body.
IMPRESSION: Equivocal nondisplaced fracture at the dorsal aspect of the hamate.
If clinical suspicion is high, CT would be conclusive.

These results will be called to the ordering clinician or
representative by the Radiologist Assistant, and communication
documented in the PACS or zVision Dashboard.

## 2019-10-21 ENCOUNTER — Other Ambulatory Visit: Payer: Self-pay

## 2019-10-21 ENCOUNTER — Ambulatory Visit
Admission: EM | Admit: 2019-10-21 | Discharge: 2019-10-21 | Disposition: A | Discharge status: Home / Self Care | Payer: Medicaid Other | Attending: Emergency Medicine | Admitting: Emergency Medicine

## 2019-10-21 ENCOUNTER — Ambulatory Visit: Admit: 2019-10-21 | Disposition: A | Discharge status: Home / Self Care | Payer: Self-pay

## 2019-10-21 DIAGNOSIS — K0889 Other specified disorders of teeth and supporting structures: Secondary | ICD-10-CM

## 2019-10-21 MED ORDER — AMOXICILLIN 875 MG PO TABS: 875.0000 mg | ORAL_TABLET | Freq: Two times a day (BID) | ORAL | 0 refills | Status: AC

## 2019-10-21 NOTE — ED Triage Notes (Signed)
Patient reports dental pain x3-4 months. States that in the last month, his lymph nodes on the right side are swollen and get bigger every time he eats. States the swelling goes down some, but never has to go down.

## 2019-10-21 NOTE — ED Provider Notes (Signed)
Johnny Carter    CSN: [id number] Arrival date & time: 10/21/19  1609      History   Chief Complaint Chief Complaint  Patient presents with   Dental Pain    HPI Johnny Carter is a 24 y.o. male.   Patient presents with dental pain in bilateral lower wisdom teeth and swollen lymph nodes x3 to 4 months.  He states he has seen a dentist and was then referred to an oral surgeon for removal of his wisdom teeth but had an issue with his insurance coverage.  He denies fever, chills, difficulty swallowing, shortness of breath, abdominal pain, or other symptoms.  Treatment attempted at home with OTC pain reliever.  The history is provided by the patient.    Past Medical History:  Diagnosis Date   Asthma     There are no problems to display for this patient.   Past Surgical History:  Procedure Laterality Date   INGUINAL HERNIA REPAIR Right        Home Medications    Prior to Admission medications   Medication Sig Start Date End Date Taking? Authorizing Provider  amoxicillin (AMOXIL) 875 MG tablet Take 1 tablet (875 mg total) by mouth 2 (two) times daily for 7 days. 10/21/19 10/28/19  Mickie Bail, NP  benzonatate (TESSALON PERLES) 100 MG capsule Allow 1 capsule to dissolve on the back of your tongue to help soothe your throat as needed every 6 hours 03/19/16   Loleta Rose, MD  fexofenadine-pseudoephedrine (ALLEGRA-D) 60-120 MG 12 hr tablet Take 1 tablet by mouth 2 (two) times daily. 04/03/17   Joni Reining, PA-C  ibuprofen (ADVIL,MOTRIN) 600 MG tablet Take 1 tablet (600 mg total) by mouth every 6 (six) hours as needed. 04/03/17   Joni Reining, PA-C  ibuprofen (ADVIL,MOTRIN) 800 MG tablet Take 1 tablet (800 mg total) by mouth every 8 (eight) hours as needed. 07/20/14   Beers, Charmayne Sheer, PA-C  magic mouthwash SOLN Take 5 mLs by mouth 3 (three) times daily as needed for mouth pain. 12/02/14   Irean Hong, MD  ondansetron (ZOFRAN-ODT) 4 MG disintegrating tablet Take 1  tablet (4 mg total) by mouth every 8 (eight) hours as needed for nausea or vomiting. 10/15/16   Chinita Pester, FNP    Family History History reviewed. No pertinent family history.  Social History Social History   Tobacco Use   Smoking status: Current Every Day Smoker    Types: Cigarettes   Smokeless tobacco: Never Used  Substance Use Topics   Alcohol use: Yes   Drug use: No     Allergies   Patient has no known allergies.   Review of Systems Review of Systems  Constitutional: Negative for chills and fever.  HENT: Positive for dental problem. Negative for ear pain, sore throat and trouble swallowing.   Eyes: Negative for pain and visual disturbance.  Respiratory: Negative for cough and shortness of breath.   Cardiovascular: Negative for chest pain and palpitations.  Gastrointestinal: Negative for abdominal pain and vomiting.  Genitourinary: Negative for dysuria and hematuria.  Musculoskeletal: Negative for arthralgias and back pain.  Skin: Negative for color change and rash.  Neurological: Negative for seizures and syncope.  All other systems reviewed and are negative.    Physical Exam Triage Vital Signs ED Triage Vitals  Enc Vitals Group     BP      Pulse      Resp      Temp  Temp src      SpO2      Weight      Height      Head Circumference      Peak Flow      Pain Score      Pain Loc      Pain Edu?      Excl. in GC?    No data found.  Updated Vital Signs BP (!) 146/89    Pulse 78    Temp 99 F (37.2 C)    Resp 14    SpO2 99%   Visual Acuity Right Eye Distance:   Left Eye Distance:   Bilateral Distance:    Right Eye Near:   Left Eye Near:    Bilateral Near:     Physical Exam Vitals and nursing note reviewed.  Constitutional:      General: He is not in acute distress.    Appearance: He is well-developed. He is not ill-appearing.  HENT:     Head: Normocephalic and atraumatic.     Mouth/Throat:     Mouth: Mucous membranes are  moist.     Pharynx: Oropharynx is clear.   Eyes:     Conjunctiva/sclera: Conjunctivae normal.  Cardiovascular:     Rate and Rhythm: Normal rate and regular rhythm.     Heart sounds: No murmur heard.   Pulmonary:     Effort: Pulmonary effort is normal. No respiratory distress.     Breath sounds: Normal breath sounds.  Abdominal:     Palpations: Abdomen is soft.     Tenderness: There is no abdominal tenderness. There is no guarding or rebound.  Musculoskeletal:     Cervical back: Neck supple.  Skin:    General: Skin is warm and dry.     Findings: No rash.  Neurological:     General: No focal deficit present.     Mental Status: He is alert and oriented to person, place, and time.     Gait: Gait normal.  Psychiatric:        Mood and Affect: Mood normal.        Behavior: Behavior normal.      UC Treatments / Results  Labs (all labs ordered are listed, but only abnormal results are displayed) Labs Reviewed - No data to display  EKG   Radiology No results found.  Procedures Procedures (including critical care time)  Medications Ordered in UC Medications - No data to display  Initial Impression / Assessment and Plan / UC Course  I have reviewed the triage vital signs and the nursing notes.  Pertinent labs & imaging results that were available during my care of the patient were reviewed by me and considered in my medical decision making (see chart for details).   Dental pain.  Treating with amoxicillin.  Dental resource guide provided.  Instructed patient to follow-up with his dentist or oral surgeon as soon as possible.  Instructed him to go to the ED if he has acute worsening symptoms.  Patient agrees to plan of care.     Final Clinical Impressions(s) / UC Diagnoses   Final diagnoses:  Pain, dental     Discharge Instructions     Take the antibiotic as prescribed.    A dental resource guide is attached.  Please call to make an appointment with a dentist as  soon as possible.    Go to the emergency department if you have acute worsening symptoms.  ED Prescriptions    Medication Sig Dispense Auth. Provider   amoxicillin (AMOXIL) 875 MG tablet Take 1 tablet (875 mg total) by mouth 2 (two) times daily for 7 days. 14 tablet Mickie Bail, NP     I have reviewed the PDMP during this encounter.   Mickie Bail, NP 10/21/19 1650

## 2019-10-21 NOTE — Discharge Instructions (Signed)
Take the antibiotic as prescribed.    A dental resource guide is attached.  Please call to make an appointment with a dentist as soon as possible.    Go to the emergency department if you have acute worsening symptoms.     

## 2021-08-14 ENCOUNTER — Emergency Department: Payer: Medicaid Other

## 2021-08-14 ENCOUNTER — Emergency Department
Admission: EM | Admit: 2021-08-14 | Discharge: 2021-08-14 | Disposition: A | Discharge status: Home / Self Care | Payer: Medicaid Other | Attending: Emergency Medicine | Admitting: Emergency Medicine

## 2021-08-14 DIAGNOSIS — M79641 Pain in right hand: Secondary | ICD-10-CM | POA: Diagnosis present

## 2021-08-14 MED ORDER — IBUPROFEN 600 MG PO TABS
600.0000 mg | ORAL_TABLET | Freq: Once | ORAL | Status: AC
  Administered 2021-08-14: 600 mg via ORAL
  Filled 2021-08-14: qty 1

## 2021-08-14 NOTE — Discharge Instructions (Addendum)
You have joint space loss in your hand suggestive of previous trauma which is likely the cause of your pain today.  He may continue to take Tylenol/ibuprofen per package directions to help with your pain.  You have also been given a wrist splint to help with your pain.  You may follow-up with orthopedics if your symptoms persist.  Please return for any new, worsening, or change in symptoms or other concerns.

## 2021-08-14 NOTE — ED Notes (Signed)
Pt to ED for R hand pain, has 2 swollen areas on dorsal surface of hand, denies new injury but states had injured hand years ago when he was a teenager and punched something. NAD at this time.

## 2021-08-14 NOTE — ED Notes (Signed)
Pt reports a doctor prescribed BP meds for him; states he has been trying to get into a primary doc recently to get BP meds adjusted if needed; pt reports he will keep trying to get an appointment with a primary doc to address chronic HTN.

## 2021-08-14 NOTE — ED Triage Notes (Signed)
Pt here with right hand pain x3 days. Pt denies injury but has prior injury to hand after hitting walls. Pt states he is not able to hold things like he normally does.

## 2021-08-14 NOTE — ED Provider Notes (Signed)
[facility] Provider Note    Event Date/Time   First MD Initiated Contact with Patient 08/14/21 [phone]     (approximate)   History   Hand Pain   HPI  Johnny Carter is a 26 y.o. male right-hand-dominant presents today for evaluation of right hand pain.  Patient reports that he has had this for the past 3 days.  He reports that he has pain specifically when rotating his wrist.  He denies any recent injuries, though reports that when he was younger he used to punch walls.  He denies ever having had a broken bone.  He denies numbness or tingling.  He has not had any weakness in his hand, though notices pain when gripping things.  No neck pain.  No pain or weakness in the rest of his arm.  He is still able to use his arm normally.  He has not taken anything for his symptoms. He has not noticed any skin color changes or swelling.  No fevers or chills.  No wounds.  There are no problems to display for this patient.         Physical Exam   Triage Vital Signs: ED Triage Vitals  Enc Vitals Group     BP 08/14/21 0804 (!) 158/108     Pulse Rate 08/14/21 0804 80     Resp 08/14/21 0804 17     Temp 08/14/21 0804 98.7 F (37.1 C)     Temp Source 08/14/21 0804 Oral     SpO2 08/14/21 0804 98 %     Weight 08/14/21 0807 210 lb (95.3 kg)     Height 08/14/21 0807 5\' 11"  (1.803 m)     Head Circumference --      Peak Flow --      Pain Score 08/14/21 0807 6     Pain Loc --      Pain Edu? --      Excl. in GC? --     Most recent vital signs: Vitals:   08/14/21 0906 08/14/21 0907  BP:    Pulse: 73 70  Resp:    Temp:    SpO2: 97% 100%    Physical Exam Vitals and nursing note reviewed.  Constitutional:      General: Awake and alert. No acute distress.    Appearance: Normal appearance. He is well-developed and normal weight.  HENT:     Head: Normocephalic and atraumatic.     Mouth/Throat:     Mouth: Mucous membranes are moist.  Eyes:     General: PERRL.  Normal EOMs        Right eye: No discharge.        Left eye: No discharge.     Conjunctiva/sclera: Conjunctivae normal.  Cardiovascular:     Rate and Rhythm: Normal rate and regular rhythm.     Pulses: Normal pulses.     Heart sounds: Normal heart sounds Pulmonary:     Effort: Pulmonary effort is normal. No respiratory distress.     Breath sounds: Normal breath sounds.  Abdominal:     Abdomen is soft. There is no abdominal tenderness. No rebound or guarding. No distention. Musculoskeletal:        General: No swelling. Normal range of motion.     Cervical back: Normal range of motion and neck supple.  No midline cervical spine tenderness.  Full range of motion of neck.  Negative Spurling test.  Negative Lhermitte sign.  Negative Hoffman sign. Normal  strength and sensation in bilateral upper extremities. Normal grip strength bilaterally.  Normal intrinsic muscle function of the hand bilaterally.  Normal radial pulses bilaterally. Right hand: No obvious deformity, swelling, ecchymosis, or erythema.  No wounds.  Tenderness to palpation along the APL and the APB and dorsum of wrist.  Able to flex wrist and extend wrist against resistance.  Discomfort with Lourena Simmonds test.  Normal radial pulse.  Able to give thumbs up, cross fingers, make okay sign and hold closed against resistance.  Negative Tinel's sign at the wrist and at the elbow.  Normal grip strength, equal bilaterally.  Sensation intact light touch to bilateral arms.  Compartment soft and compressible throughout.  Normal strength and sensation to bilateral upper extremities, equal bilaterally  Lymphadenopathy:     Cervical: No cervical adenopathy.  Skin:    General: Skin is warm and dry.     Capillary Refill: Capillary refill takes less than 2 seconds.     Findings: No rash.  Neurological:     Mental Status: He is alert.      ED Results / Procedures / Treatments   Labs (all labs ordered are listed, but only abnormal results are  displayed) Labs Reviewed - No data to display   EKG     RADIOLOGY I independently reviewed imaging and agree with radiologist findings.    PROCEDURES:  Critical Care performed:   Procedures   MEDICATIONS ORDERED IN ED: Medications  ibuprofen (ADVIL) tablet 600 mg (600 mg Oral Given 08/14/21 1610)     IMPRESSION / MDM / ASSESSMENT AND PLAN / ED COURSE  I reviewed the triage vital signs and the nursing notes.   Differential diagnosis includes, but is not limited to, tendinitis, fracture, dislocation, decor veins.  Patient is awake and alert, hemodynamically stable and afebrile.  He has no cervical spine tenderness, negative Lhermitte, Spurling, and Hoffmann signs, do not suspect cervical spine etiology.  He has normal grip strength bilaterally, not consistent with central cord syndrome.  He has reproducible tenderness to palpation over the dorsum of his wrist and into his base of fifth metacarpal.  No obvious deformity.  No malrotation.  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  This is the location of his pain, and he does admit to punching things when he was younger.  No acute osseous injury.  He has normal range of motion of all of his fingers, normal intrinsic muscle function of the hand.  Do not suspect tendon or nerve injury.  No signs or symptoms of infection.  He was given a Velcro wrist splint for fracture support.  He was instructed to follow-up with orthopedics if his pain and symptoms persist.  We discussed return precautions and symptomatic management.  Patient understands and agrees with plan.  He was discharged in stable condition.     FINAL CLINICAL IMPRESSION(S) / ED DIAGNOSES   Final diagnoses:  Right hand pain     Rx / DC Orders   ED Discharge Orders     None        Note:  This document was prepared using Dragon voice recognition software and may include unintentional dictation  errors.   Keturah Shavers 08/14/21 1411    Minna Antis, MD 08/14/21 [phone]

## 2022-06-21 DIAGNOSIS — K0889 Other specified disorders of teeth and supporting structures: Secondary | ICD-10-CM | POA: Diagnosis present

## 2022-06-21 DIAGNOSIS — J45909 Unspecified asthma, uncomplicated: Secondary | ICD-10-CM | POA: Diagnosis not present

## 2022-06-21 DIAGNOSIS — R590 Localized enlarged lymph nodes: Secondary | ICD-10-CM | POA: Insufficient documentation

## 2022-06-22 ENCOUNTER — Emergency Department
Admission: EM | Admit: 2022-06-22 | Discharge: 2022-06-22 | Disposition: A | Discharge status: Home / Self Care | Payer: Medicaid Other | Attending: Emergency Medicine | Admitting: Emergency Medicine

## 2022-06-22 ENCOUNTER — Other Ambulatory Visit: Payer: Self-pay

## 2022-06-22 DIAGNOSIS — K0889 Other specified disorders of teeth and supporting structures: Secondary | ICD-10-CM

## 2022-06-22 DIAGNOSIS — R590 Localized enlarged lymph nodes: Secondary | ICD-10-CM

## 2022-06-22 NOTE — ED Provider Notes (Signed)
[facility] Provider Note    Event Date/Time   First MD Initiated Contact with Patient 06/22/22 [phone]     (approximate)   History   Dental Pain   HPI  Johnny Carter is a 27 y.o. male past medical history of asthma on Suboxone presents with ear pain and tooth pain and concern for swollen lymph nodes.  Patient notes that for the last 7 to 8 months he has had bilateral cervical swollen lymph nodes.  Worse on the right side.  Hurts when he eats.  Over the last several days he has had pain in the a right upper tooth feels like he has an abscess.  Also pain in the right ear.  Denies fevers chills weight loss night sweats.  He denies any IV drug use.  Says he has been HIV tested in the last 6 months and does not want testing today.  Does not currently have a primary doctor     Past Medical History:  Diagnosis Date   Asthma     There are no problems to display for this patient.    Physical Exam  Triage Vital Signs: ED Triage Vitals  Enc Vitals Group     BP 06/22/22 0005 (!) 164/99     Pulse Rate 06/22/22 0005 87     Resp 06/22/22 0005 16     Temp 06/22/22 0005 98.8 F (37.1 C)     Temp Source 06/22/22 0005 Oral     SpO2 06/22/22 0005 97 %     Weight --      Height 06/22/22 0006 5\' 11"  (1.803 m)     Head Circumference --      Peak Flow --      Pain Score 06/22/22 0005 7     Pain Loc --      Pain Edu? --      Excl. in GC? --     Most recent vital signs: Vitals:   06/22/22 0005 06/22/22 0318  BP: (!) 164/99 (!) 147/103  Pulse: 87 63  Resp: 16 20  Temp: 98.8 F (37.1 C)   SpO2: 97% 100%     General: Awake, no distress.  CV:  Good peripheral perfusion.  Resp:  Normal effort.  Abd:  No distention.  Neuro:             Awake, Alert, Oriented x 3  Other:  Shotty submandibular lymphadenopathy symmetric bilaterally nontender no significant cervical lymphadenopathy palpated Poor dentition, patient is missing a right upper molar, no  significant fluctuance or tenderness to percussion of this area or neighboring teeth no trismus  TMs are clear bilaterally  ED Results / Procedures / Treatments  Labs (all labs ordered are listed, but only abnormal results are displayed) Labs Reviewed - No data to display   EKG     RADIOLOGY    PROCEDURES:  Critical Care performed: No  Procedures  MEDICATIONS ORDERED IN ED: Medications - No data to display   IMPRESSION / MDM / ASSESSMENT AND PLAN / ED COURSE  I reviewed the triage vital signs and the nursing notes.                              Patient's presentation is most consistent with acute, uncomplicated illness.  Differential diagnosis includes, but is not limited to, reactive lymphadenopathy, HIV, low suspicion for malignancy  Patient is a 27 year old male who presents because  of 8 months of cervical lymphadenopathy.  He also has right ear pain and pain of right upper tooth.  He does have poor dentition and is missing right upper molar feels like there is an abscess in the spot of the missing tooth but it is not fluctuant nontender there is no swelling no trismus does not appear clinically infected to me.  In terms of the lymphadenopathy he does have some prominent lymph nodes in the submandibular region no significant cervical lymphadenopathy they are symmetric mobile nontender.  Question whether these are reactive in the setting of his poor dentition.  Low suspicion for lymphoma or other malignancy at this time given no other symptoms and the fact that there is submandibular with his dentition feeling this is likely the source.  I did offer patient HIV testing which he declined.  Recommend he follow-up with primary care to have basic labs and to have his lymphadenopathy followed as he may need an ultrasound at some point but do not feel that this is urgent today in the ER.  In terms of his ear pain the TMs are clear may be eustachian tube dysfunction but do not feel  that he needs any antibiotics at this time.       FINAL CLINICAL IMPRESSION(S) / ED DIAGNOSES   Final diagnoses:  Pain, dental  Submandibular lymphadenopathy     Rx / DC Orders   ED Discharge Orders     None        Note:  This document was prepared using Dragon voice recognition software and may include unintentional dictation errors.   Georga HackingMcHugh, Tyrena Gohr Rose, MD 06/22/22 [phone]

## 2022-06-22 NOTE — ED Triage Notes (Signed)
Pt to ED via POV c/o dental pain for 2 days and swollen lymph nodes for 7-8 months. Pt reports swelling and pain to right side of neck to mouth. Pt says he has abscess to lower right gum. Denies CP, SOB, dizziness, fevers

## 2022-06-22 NOTE — Discharge Instructions (Addendum)
Please follow-up with a dentist.  Please follow-up with one of the primary care doctors listed below as you should have a physical and to have your lymph nodes monitored.  Please go to the following website to schedule new (and existing) patient appointments:   [email or web address]   The following is a list of primary care offices in the area who are accepting new patients at this time.  Please reach out to one of them directly and let them know you would like to schedule an appointment to follow up on an Emergency Department visit, and/or to establish a new primary care provider (PCP).  There are likely other primary care clinics in the are who are accepting new patients, but this is an excellent place to start:  [facility] Lead physician: Dr Shirlee Latch [address] [place], [place] [address] [phone]  [facility] Lead Physician: Dr [place] Cory [address], [place], [place] [address] [phone]  [facility]  Lead Physician: Dr Olevia Perches [address] [place], [place] [address] [phone]  [facility] Lead Physician: Dr Sofie Hartigan [address], [place], [place] [address] [phone]  [facility] at [facility] Lead Physician: Dr Bari Edward [address] [place], [place], [place] [address] [phone]

## 2022-06-22 NOTE — ED Notes (Signed)
NAD noted at time of D/C. Discussed with patient importance of primary care follow up at this time. Pt appears upset, states "I just wanted some antibiotics" and "I should have gone to a [place] hospital". Pt ambulatory to the lobby at this time.

## 2022-09-13 ENCOUNTER — Emergency Department: Payer: Medicaid Other

## 2022-09-13 ENCOUNTER — Emergency Department
Admission: EM | Admit: 2022-09-13 | Discharge: 2022-09-13 | Disposition: A | Discharge status: Home / Self Care | Payer: Medicaid Other | Attending: Emergency Medicine | Admitting: Emergency Medicine

## 2022-09-13 ENCOUNTER — Other Ambulatory Visit: Payer: Self-pay

## 2022-09-13 DIAGNOSIS — S91112A Laceration without foreign body of left great toe without damage to nail, initial encounter: Secondary | ICD-10-CM | POA: Insufficient documentation

## 2022-09-13 DIAGNOSIS — W25XXXA Contact with sharp glass, initial encounter: Secondary | ICD-10-CM | POA: Insufficient documentation

## 2022-09-13 DIAGNOSIS — Z23 Encounter for immunization: Secondary | ICD-10-CM | POA: Insufficient documentation

## 2022-09-13 MED ORDER — CEPHALEXIN 500 MG PO CAPS
500.0000 mg | ORAL_CAPSULE | Freq: Once | ORAL | Status: AC
  Administered 2022-09-13: 500 mg via ORAL
  Filled 2022-09-13: qty 1

## 2022-09-13 MED ORDER — LIDOCAINE-EPINEPHRINE-TETRACAINE (LET) TOPICAL GEL: 3.0000 mL | Freq: Once | TOPICAL | Status: DC

## 2022-09-13 MED ORDER — TETANUS-DIPHTH-ACELL PERTUSSIS 5-2.5-18.5 LF-MCG/0.5 IM SUSY
0.5000 mL | PREFILLED_SYRINGE | Freq: Once | INTRAMUSCULAR | Status: AC
  Administered 2022-09-13: 0.5 mL via INTRAMUSCULAR
  Filled 2022-09-13: qty 0.5

## 2022-09-13 MED ORDER — CEPHALEXIN 500 MG PO CAPS: 500.0000 mg | ORAL_CAPSULE | Freq: Four times a day (QID) | ORAL | 0 refills | Status: AC

## 2022-09-13 NOTE — ED Triage Notes (Signed)
Pt to ed from home via POV for laceration to bottom of left BIG toe and foot. Bleeding controlled at this time. Pt stepped on some busted up beer glass. Pt is CAOx4, in no acute distress and ambulatory in triage. Wounds re-bandaged for bleeding controlled.

## 2022-09-13 NOTE — ED Provider Notes (Signed)
[place] EMERGENCY DEPARTMENT AT [facility] Provider Note   CSN: [id number] Arrival date & time: 09/13/22  1755     History  Chief Complaint  Patient presents with   Laceration    Left great toe and later bottom of foot    Johnny Carter is a 27 y.o. male presents to the emergency department for evaluation of left great toe laceration.  Volar aspect of the left great toe shows a small 2 cm laceration.  Patient states he stepped on some glass.  Bleeding well-controlled.  Tetanus status unknown.  HPI     Home Medications Prior to Admission medications   Medication Sig Start Date End Date Taking? Authorizing Provider  cephALEXin (KEFLEX) 500 MG capsule Take 1 capsule (500 mg total) by mouth 4 (four) times daily for 7 days. 09/13/22 09/20/22 Yes Evon Slack, PA-C  benzonatate (TESSALON PERLES) 100 MG capsule Allow 1 capsule to dissolve on the back of your tongue to help soothe your throat as needed every 6 hours 03/19/16   Loleta Rose, MD  fexofenadine-pseudoephedrine (ALLEGRA-D) 60-120 MG 12 hr tablet Take 1 tablet by mouth 2 (two) times daily. 04/03/17   Joni Reining, PA-C  ibuprofen (ADVIL,MOTRIN) 600 MG tablet Take 1 tablet (600 mg total) by mouth every 6 (six) hours as needed. 04/03/17   Joni Reining, PA-C  ibuprofen (ADVIL,MOTRIN) 800 MG tablet Take 1 tablet (800 mg total) by mouth every 8 (eight) hours as needed. 07/20/14   Beers, Charmayne Sheer, PA-C  magic mouthwash SOLN Take 5 mLs by mouth 3 (three) times daily as needed for mouth pain. 12/02/14   Irean Hong, MD  ondansetron (ZOFRAN-ODT) 4 MG disintegrating tablet Take 1 tablet (4 mg total) by mouth every 8 (eight) hours as needed for nausea or vomiting. 10/15/16   Kem Boroughs B, FNP      Allergies    Patient has no known allergies.    Review of Systems   Review of Systems  Physical Exam Updated Vital Signs BP (!) 171/108 (BP Location: Right Arm)   Pulse 86   Temp 98.6 F (37 C) (Oral)   Resp 16    Ht 5\' 11"  (1.803 m)   Wt 102.1 kg   SpO2 98%   BMI 31.38 kg/m  Physical Exam Constitutional:      Appearance: He is well-developed.  HENT:     Head: Normocephalic and atraumatic.  Eyes:     Conjunctiva/sclera: Conjunctivae normal.  Cardiovascular:     Rate and Rhythm: Normal rate.  Pulmonary:     Effort: Pulmonary effort is normal. No respiratory distress.  Musculoskeletal:        General: Normal range of motion.     Cervical back: Normal range of motion.     Comments: Examination of the left foot shows plantar aspect of the great toe with a small 2 cm laceration, laceration superficial and flap-like, laceration thoroughly irrigated with 30 cc of pressurized saline/Betadine.  No visible or palpable foreign body.  X-ray showed no radiopaque foreign body.  Laceration repaired with two 4-0 nylon sutures.  Patient able to flex and extend the toe actively  Skin:    General: Skin is warm.     Findings: No rash.  Neurological:     Mental Status: He is alert and oriented to person, place, and time.  Psychiatric:        Behavior: Behavior normal.        Thought Content: Thought content  normal.     ED Results / Procedures / Treatments   Labs (all labs ordered are listed, but only abnormal results are displayed) Labs Reviewed - No data to display  EKG None  Radiology DG Foot Complete Left  Result Date: 09/13/2022 CLINICAL DATA:  Laceration plantar aspect left forefoot EXAM: LEFT FOOT - COMPLETE 3+ VIEW COMPARISON:  None Available. FINDINGS: Frontal, oblique, and lateral views of the left foot are obtained. No acute fracture, subluxation, or dislocation. There is soft tissue swelling throughout the forefoot. No subcutaneous gas or radiopaque foreign body. IMPRESSION: 1. Forefoot soft tissue swelling. No subcutaneous gas or radiopaque foreign body. 2. No acute bony abnormality. Electronically Signed   By: Sharlet Salina M.D.   On: 09/13/2022 19:00    Procedures Procedures     Medications Ordered in ED Medications  Tdap (BOOSTRIX) injection 0.5 mL (0.5 mLs Intramuscular Given 09/13/22 2009)  cephALEXin (KEFLEX) capsule 500 mg (500 mg Oral Given 09/13/22 2008)    ED Course/ Medical Decision Making/ A&P                             Medical Decision Making Amount and/or Complexity of Data Reviewed Radiology: ordered.  Risk Prescription drug management.   27 year old with laceration to the plantar aspect of the left great toe.  Laceration superficial.  No visible or palpable foreign body.  X-rays negative for any radiopaque foreign body.  Patient placed on prophylactic antibiotics.  Tetanus updated.  He is educated on wound care and signs and symptoms return to the ER for. Final Clinical Impression(s) / ED Diagnoses Final diagnoses:  Laceration of left great toe without foreign body present or damage to nail, initial encounter    Rx / DC Orders ED Discharge Orders          Ordered    cephALEXin (KEFLEX) 500 MG capsule  4 times daily        09/13/22 2032              Ronnette Juniper 09/13/22 2035    Phineas Semen, MD 09/13/22 2303

## 2022-09-13 NOTE — Discharge Instructions (Signed)
Please take antibiotics as prescribed.  Keep laceration site clean and covered and dry during the day.  You may get the laceration site wet but do not submerge underwater.  Avoid dirty water.  Return to the ER for any increasing pain swelling warmth redness or drainage

## 2023-03-03 ENCOUNTER — Ambulatory Visit
Admission: EM | Admit: 2023-03-03 | Discharge: 2023-03-03 | Disposition: A | Discharge status: Home / Self Care | Payer: MEDICAID | Attending: Physician Assistant | Admitting: Physician Assistant

## 2023-03-03 DIAGNOSIS — F17210 Nicotine dependence, cigarettes, uncomplicated: Secondary | ICD-10-CM | POA: Diagnosis not present

## 2023-03-03 DIAGNOSIS — K089 Disorder of teeth and supporting structures, unspecified: Secondary | ICD-10-CM | POA: Insufficient documentation

## 2023-03-03 DIAGNOSIS — R591 Generalized enlarged lymph nodes: Secondary | ICD-10-CM | POA: Diagnosis not present

## 2023-03-03 DIAGNOSIS — R590 Localized enlarged lymph nodes: Secondary | ICD-10-CM | POA: Diagnosis present

## 2023-03-03 DIAGNOSIS — J45909 Unspecified asthma, uncomplicated: Secondary | ICD-10-CM | POA: Diagnosis not present

## 2023-03-03 LAB — CBC WITH DIFFERENTIAL/PLATELET
Abs Immature Granulocytes: 0.04 10*3/uL (ref 0.00–0.07)
Basophils Absolute: 0 10*3/uL (ref 0.0–0.1)
Basophils Relative: 0 %
Eosinophils Absolute: 0.2 10*3/uL (ref 0.0–0.5)
Eosinophils Relative: 2 %
HCT: 42.7 % (ref 39.0–52.0)
Hemoglobin: 14.8 g/dL (ref 13.0–17.0)
Immature Granulocytes: 0 %
Lymphocytes Relative: 36 %
Lymphs Abs: 3.9 10*3/uL (ref 0.7–4.0)
MCH: 31.1 pg (ref 26.0–34.0)
MCHC: 34.7 g/dL (ref 30.0–36.0)
MCV: 89.7 fL (ref 80.0–100.0)
Monocytes Absolute: 0.8 10*3/uL (ref 0.1–1.0)
Monocytes Relative: 8 %
Neutro Abs: 6 10*3/uL (ref 1.7–7.7)
Neutrophils Relative %: 54 %
Platelets: 298 10*3/uL (ref 150–400)
RBC: 4.76 MIL/uL (ref 4.22–5.81)
RDW: 12.6 % (ref 11.5–15.5)
WBC: 11 10*3/uL — ABNORMAL HIGH (ref 4.0–10.5)
nRBC: 0 % (ref 0.0–0.2)

## 2023-03-03 LAB — COMPREHENSIVE METABOLIC PANEL
ALT: 31 U/L (ref 0–44)
AST: 16 U/L (ref 15–41)
Albumin: 4.3 g/dL (ref 3.5–5.0)
Alkaline Phosphatase: 75 U/L (ref 38–126)
Anion gap: 6 (ref 5–15)
BUN: 9 mg/dL (ref 6–20)
CO2: 26 mmol/L (ref 22–32)
Calcium: 9.3 mg/dL (ref 8.9–10.3)
Chloride: 104 mmol/L (ref 98–111)
Creatinine, Ser: 0.96 mg/dL (ref 0.61–1.24)
GFR, Estimated: >60 mL/min (ref >60)
Glucose, Bld: 94 mg/dL (ref 70–99)
Potassium: 4.1 mmol/L (ref 3.5–5.1)
Sodium: 136 mmol/L (ref 135–145)
Total Bilirubin: 0.7 mg/dL (ref <1.2)
Total Protein: 7.9 g/dL (ref 6.5–8.1)

## 2023-03-03 MED ORDER — AMOXICILLIN-POT CLAVULANATE 875-125 MG PO TABS: 1.0000 | ORAL_TABLET | Freq: Two times a day (BID) | ORAL | 0 refills | Status: AC

## 2023-03-03 NOTE — Discharge Instructions (Addendum)
-  Begin antibiotics.  I think your lymph node swelling to be related to your dental disease.  Continue follow-up with dentist/old otologist. - If you notice other areas of lymph node swelling or the lymph node swelling does not go down over a few weeks follow-up with your primary care provider regarding this as you might need to have more extensive workup including lab work such as to check you for autoimmune disease or an ultrasound of her neck.

## 2023-03-03 NOTE — ED Provider Notes (Signed)
[facility]    CSN: [id number] Arrival date & time: 03/03/23  1658      History   Chief Complaint Chief Complaint  Patient presents with   Lymphadenopathy   Loss of appetite    HPI Johnny Carter is a 27 y.o. male presenting for 2 to 3-week history of swollen lymph nodes of the anterior cervical neck.  Reports reduced appetite due to pain with swallowing.  He says his throat is not sore but it causes pain in lymph nodes when he swallows.  He has not had any fevers, unintentional weight loss, cough, congestion, sinus pain, chest pain and wheezing, shortness of breath, headaches, dizziness, weakness, vomiting.  He does report significant dental disease and has an appointment with dentist later this month.  States he is going to have a lot of teeth removed.  He says that he has randomly had lymph node swelling intermittently over the past couple of years.  He has been at the ER before and states all they wanted to do was checking for HIV.  He says he has been checked for those kind of things every 6 months because he donates plasma.  He is always been negative.  He denies any known autoimmune diseases.  History of asthma.  Every day smoker.  HPI  Past Medical History:  Diagnosis Date   Asthma     There are no active problems to display for this patient.   Past Surgical History:  Procedure Laterality Date   INGUINAL HERNIA REPAIR Right        Home Medications    Prior to Admission medications   Medication Sig Start Date End Date Taking? Authorizing Provider  amoxicillin-clavulanate (AUGMENTIN) 875-125 MG tablet Take 1 tablet by mouth every 12 (twelve) hours for 7 days. 03/03/23 03/10/23 Yes Shirlee Latch, PA-C  buPROPion (WELLBUTRIN XL) 150 MG 24 hr tablet Take 150 mg by mouth 2 (two) times daily. 12/13/22  Yes [provider]  buPROPion (WELLBUTRIN XL) 300 MG 24 hr tablet Take 300 mg by mouth daily. 02/19/23  Yes [provider]  gabapentin  (NEURONTIN) 800 MG tablet Take 800 mg by mouth 4 (four) times daily.   Yes [provider]  magic mouthwash SOLN Take 5 mLs by mouth 3 (three) times daily as needed for mouth pain. 12/02/14   Irean Hong, MD  SUBOXONE 8-2 MG FILM Place under the tongue 3 (three) times daily.   Yes [provider]    Family History History reviewed. No pertinent family history.  Social History Social History   Tobacco Use   Smoking status: Every Day    Types: Cigarettes   Smokeless tobacco: Never  Substance Use Topics   Alcohol use: Yes   Drug use: No     Allergies   Patient has no known allergies.   Review of Systems Review of Systems  Constitutional:  Positive for appetite change. Negative for fatigue and unexpected weight change.  HENT:  Positive for dental problem. Negative for congestion, rhinorrhea, sore throat, trouble swallowing and voice change.   Respiratory:  Negative for cough.   Gastrointestinal:  Negative for abdominal pain and vomiting.  Neurological:  Negative for dizziness, weakness and headaches.  Hematological:  Positive for adenopathy.     Physical Exam Triage Vital Signs ED Triage Vitals  Encounter Vitals Group     BP 03/03/23 1740 (!) 160/106     Systolic BP Percentile --      Diastolic  BP Percentile --      Pulse Rate 03/03/23 1740 90     Resp 03/03/23 1740 16     Temp 03/03/23 1740 98.4 F (36.9 C)     Temp Source 03/03/23 1740 Oral     SpO2 03/03/23 1740 100 %     Weight 03/03/23 1737 205 lb (93 kg)     Height 03/03/23 1737 5\' 11"  (1.803 m)     Head Circumference --      Peak Flow --      Pain Score 03/03/23 1744 0     Pain Loc --      Pain Education --      Exclude from Growth Chart --    No data found.  Updated Vital Signs BP (!) 160/106 (BP Location: Left Arm)   Pulse 90   Temp 98.4 F (36.9 C) (Oral)   Resp 16   Ht 5\' 11"  (1.803 m)   Wt 205 lb (93 kg)   SpO2 100%   BMI 28.59 kg/m   Physical Exam Vitals and nursing  note reviewed.  Constitutional:      General: He is not in acute distress.    Appearance: Normal appearance. He is well-developed. He is not ill-appearing.  HENT:     Head: Normocephalic and atraumatic.     Nose: Nose normal.     Mouth/Throat:     Mouth: Mucous membranes are moist.     Dentition: Abnormal dentition. Dental tenderness and dental caries present.     Pharynx: Oropharynx is clear.  Eyes:     General: No scleral icterus.    Conjunctiva/sclera: Conjunctivae normal.  Cardiovascular:     Rate and Rhythm: Normal rate and regular rhythm.     Heart sounds: Normal heart sounds.  Pulmonary:     Effort: Pulmonary effort is normal. No respiratory distress.     Breath sounds: Normal breath sounds.  Musculoskeletal:     Cervical back: Neck supple.  Lymphadenopathy:     Cervical: Cervical adenopathy present.  Skin:    General: Skin is warm and dry.     Capillary Refill: Capillary refill takes less than 2 seconds.  Neurological:     General: No focal deficit present.     Mental Status: He is alert. Mental status is at baseline.     Motor: No weakness.     Gait: Gait normal.  Psychiatric:        Mood and Affect: Mood normal.        Behavior: Behavior normal.      UC Treatments / Results  Labs (all labs ordered are listed, but only abnormal results are displayed) Labs Reviewed  CBC WITH DIFFERENTIAL/PLATELET - Abnormal; Notable for the following components:      Result Value   WBC 11.0 (*)    All other components within normal limits  COMPREHENSIVE METABOLIC PANEL    EKG   Radiology No results found.  Procedures Procedures (including critical care time)  Medications Ordered in UC Medications - No data to display  Initial Impression / Assessment and Plan / UC Course  I have reviewed the triage vital signs and the nursing notes.  Pertinent labs & imaging results that were available during my care of the patient were reviewed by me and considered in my  medical decision making (see chart for details).   27 year old male presents for anterior cervical lymphadenopathy x 2 to 3 weeks.  Also reports extensive dental and gum disease.  Has an appointment with dentistry at the end of the month.  States he is to have a consult and further consideration of dental surgery.  He has not had any URI symptoms, fevers, weakness, weight loss.  Has been seen for lymphadenopathy in the past a few times.  Does have a primary care provider and has an appointment upcoming in the next couple weeks.  He is afebrile and overall well-appearing.  On exam he has significant dental decay and gum disease.  Tender anterior cervical lymphadenopathy on the right side.  Enlarged anterior cervical lymph node on the left side.  No other areas of lymphadenopathy palpable of the head or neck.  No supraclavicular lymph node swelling.  Remainder of HEENT exam is normal.  Chest clear.  Heart regular rate and rhythm.  CBC with slightly elevated WBC count of 11.  Otherwise normal CBC.  CMP negative.  Suspect patient's lymphadenopathy is related to his dental and gum disease.  He would like to try an antibiotic at this time. Sent Augmentin to pharmacy advised him to continue follow-up with dentistry/orthodontics.  Also advised him to discuss the problem with his primary care provider.  If the lymphadenopathy does not resolve in the next few weeks he may need to have further workup including labs to assess for autoimmune disease, ultrasound, etc.   Final Clinical Impressions(s) / UC Diagnoses   Final diagnoses:  Lymphadenopathy of head and neck  Dental disease     Discharge Instructions      -Begin antibiotics.  I think your lymph node swelling to be related to your dental disease.  Continue follow-up with dentist/old otologist. - If you notice other areas of lymph node swelling or the lymph node swelling does not go down over a few weeks follow-up with your primary care provider  regarding this as you might need to have more extensive workup including lab work such as to check you for autoimmune disease or an ultrasound of her neck.     ED Prescriptions     Medication Sig Dispense Auth. Provider   amoxicillin-clavulanate (AUGMENTIN) 875-125 MG tablet Take 1 tablet by mouth every 12 (twelve) hours for 7 days. 14 tablet Gareth Morgan      PDMP not reviewed this encounter.   Shirlee Latch, PA-C 03/03/23 [phone]

## 2023-03-03 NOTE — ED Triage Notes (Signed)
Pt c/o swollen lymph node & loss of appetite x60 days.

## 2023-04-06 ENCOUNTER — Other Ambulatory Visit: Payer: Self-pay

## 2023-04-06 DIAGNOSIS — R220 Localized swelling, mass and lump, head: Secondary | ICD-10-CM | POA: Diagnosis present

## 2023-04-06 DIAGNOSIS — K047 Periapical abscess without sinus: Secondary | ICD-10-CM | POA: Insufficient documentation

## 2023-04-06 NOTE — ED Triage Notes (Signed)
Pt reports left side facial swelling that began last night, pt reports upper left dental pain. Pt reports he has appointment in may to get teeth pulled.

## 2023-04-07 ENCOUNTER — Emergency Department
Admission: EM | Admit: 2023-04-07 | Discharge: 2023-04-07 | Disposition: A | Discharge status: Home / Self Care | Payer: MEDICAID | Attending: Emergency Medicine | Admitting: Emergency Medicine

## 2023-04-07 DIAGNOSIS — K047 Periapical abscess without sinus: Secondary | ICD-10-CM

## 2023-04-07 DIAGNOSIS — R220 Localized swelling, mass and lump, head: Secondary | ICD-10-CM

## 2023-04-07 MED ORDER — CLINDAMYCIN HCL 150 MG PO CAPS
300.0000 mg | ORAL_CAPSULE | Freq: Once | ORAL | Status: AC
  Administered 2023-04-07: 300 mg via ORAL
  Filled 2023-04-07: qty 2

## 2023-04-07 MED ORDER — CLINDAMYCIN HCL 300 MG PO CAPS: 300.0000 mg | ORAL_CAPSULE | Freq: Three times a day (TID) | ORAL | 0 refills | Status: AC

## 2023-04-07 MED ORDER — KETOROLAC TROMETHAMINE 10 MG PO TABS: 10.0000 mg | ORAL_TABLET | Freq: Four times a day (QID) | ORAL | 0 refills | Status: AC | PRN

## 2023-04-07 NOTE — ED Provider Notes (Signed)
[facility] Provider Note    Event Date/Time   First MD Initiated Contact with Patient 04/07/23 [phone]     (approximate)   History   Chief Complaint Facial Swelling   HPI  Blair Brull Ngo is a 28 y.o. male with past medical history of asthma who presents to the ED complaining of facial swelling.  Patient reports that over the past 3 days he has been dealing with increasing left upper dental pain along with swelling over the left side of his face.  He reports being seen for similar symptoms over the right side of his face and diagnosed with dental infection last month at [facility].  He completed a course of Augmentin and has an appointment to see a dentist in March.  He denies any fevers, has had pain when moving his jaw but denies any difficulty swallowing.     Physical Exam   Triage Vital Signs: ED Triage Vitals  Encounter Vitals Group     BP 04/06/23 2247 (!) 171/94     Systolic BP Percentile --      Diastolic BP Percentile --      Pulse Rate 04/06/23 2247 92     Resp 04/06/23 2247 18     Temp 04/06/23 2247 98.1 F (36.7 C)     Temp Source 04/06/23 2247 Oral     SpO2 04/06/23 2247 100 %     Weight 04/06/23 2246 205 lb (93 kg)     Height 04/06/23 2246 5\' 11"  (1.803 m)     Head Circumference --      Peak Flow --      Pain Score 04/06/23 2246 5     Pain Loc --      Pain Education --      Exclude from Growth Chart --     Most recent vital signs: Vitals:   04/06/23 2247  BP: (!) 171/94  Pulse: 92  Resp: 18  Temp: 98.1 F (36.7 C)  SpO2: 100%    Constitutional: Alert and oriented. Eyes: Conjunctivae are normal. Head: Atraumatic. Nose: No congestion/rhinnorhea. Mouth/Throat: Mucous membranes are moist.  Erythema and edema over left upper gums with no focal fluctuance noted.  No trismus. Neck: Tender left cervical lymphadenopathy. Cardiovascular: Normal rate, regular rhythm. Grossly normal heart sounds.  2+ radial pulses  bilaterally. Respiratory: Normal respiratory effort.  No retractions. Lungs CTAB. Gastrointestinal: Soft and nontender. No distention. Musculoskeletal: No lower extremity tenderness nor edema.  Neurologic:  Normal speech and language. No gross focal neurologic deficits are appreciated.    ED Results / Procedures / Treatments   Labs (all labs ordered are listed, but only abnormal results are displayed) Labs Reviewed - No data to display   PROCEDURES:  Critical Care performed: No  Procedures   MEDICATIONS ORDERED IN ED: Medications  clindamycin (CLEOCIN) capsule 300 mg (has no administration in time range)     IMPRESSION / MDM / ASSESSMENT AND PLAN / ED COURSE  I reviewed the triage vital signs and the nursing notes.                              28 y.o. male with past medical history of asthma who presents to the ED complaining of pain and swelling over the left upper portion of his face and teeth.  Patient's presentation is most consistent with acute, uncomplicated illness.  Differential diagnosis includes, but is not limited to, pulpitis,  dental abscess, facial cellulitis.  Patient well-appearing and in no acute distress, vital signs are unremarkable.  He has evidence of dental infection and may be some early facial cellulitis but no evidence of abscess necessitating drainage at this time.  He is appropriate for outpatient management, will treat with clindamycin given his recent course of Augmentin for the other side of his mouth.  I emphasized the need for him to follow-up with a dentist and information provided for local dentistry.  He was counseled to return to the ED for new or worsening symptoms, patient agrees with plan.      FINAL CLINICAL IMPRESSION(S) / ED DIAGNOSES   Final diagnoses:  Facial swelling  Dental infection     Rx / DC Orders   ED Discharge Orders          Ordered    clindamycin (CLEOCIN) 300 MG capsule  3 times daily        04/07/23 0059     ketorolac (TORADOL) 10 MG tablet  Every 6 hours PRN        04/07/23 0059             Note:  This document was prepared using Dragon voice recognition software and may include unintentional dictation errors.   Chesley Noon, MD 04/07/23 [phone]

## 2023-05-15 ENCOUNTER — Encounter: Payer: Self-pay | Admitting: *Deleted

## 2023-05-19 ENCOUNTER — Ambulatory Visit: Payer: MEDICAID | Admitting: Cardiology

## 2023-07-29 ENCOUNTER — Ambulatory Visit: Payer: MEDICAID | Attending: Cardiology | Admitting: Cardiology

## 2023-08-03 ENCOUNTER — Encounter: Payer: Self-pay | Admitting: *Deleted

## 2023-08-03 ENCOUNTER — Other Ambulatory Visit: Payer: Self-pay

## 2023-08-03 DIAGNOSIS — T23212A Burn of second degree of left thumb (nail), initial encounter: Secondary | ICD-10-CM | POA: Insufficient documentation

## 2023-08-03 DIAGNOSIS — J45909 Unspecified asthma, uncomplicated: Secondary | ICD-10-CM | POA: Diagnosis not present

## 2023-08-03 DIAGNOSIS — X118XXA Contact with other hot tap-water, initial encounter: Secondary | ICD-10-CM | POA: Insufficient documentation

## 2023-08-03 DIAGNOSIS — T310 Burns involving less than 10% of body surface: Secondary | ICD-10-CM | POA: Insufficient documentation

## 2023-08-03 NOTE — ED Notes (Signed)
 Dry dressing applied in triage.
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
## 2023-08-04 ENCOUNTER — Emergency Department
Admission: EM | Admit: 2023-08-04 | Discharge: 2023-08-04 | Disposition: A | Discharge status: Home / Self Care | Payer: MEDICAID | Attending: Emergency Medicine | Admitting: Emergency Medicine

## 2023-08-04 DIAGNOSIS — T23212A Burn of second degree of left thumb (nail), initial encounter: Secondary | ICD-10-CM

## 2023-08-04 MED ORDER — TRAMADOL HCL 50 MG PO TABS: 50.0000 mg | ORAL_TABLET | Freq: Four times a day (QID) | ORAL | 0 refills | Status: AC | PRN

## 2023-08-04 MED ORDER — TRAMADOL HCL 50 MG PO TABS
50.0000 mg | ORAL_TABLET | Freq: Once | ORAL | Status: AC
  Administered 2023-08-04: 50 mg via ORAL
  Filled 2023-08-04: qty 1

## 2023-08-04 MED ORDER — NAPROXEN 500 MG PO TABS
500.0000 mg | ORAL_TABLET | Freq: Once | ORAL | Status: AC
  Administered 2023-08-04: 500 mg via ORAL
  Filled 2023-08-04: qty 1

## 2023-08-04 MED ORDER — NAPROXEN 500 MG PO TABS: 500.0000 mg | ORAL_TABLET | Freq: Two times a day (BID) | ORAL | 0 refills | Status: AC
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
## 2023-11-25 ENCOUNTER — Other Ambulatory Visit: Payer: Self-pay | Admitting: Otolaryngology

## 2023-11-25 DIAGNOSIS — K115 Sialolithiasis: Secondary | ICD-10-CM

## 2023-12-17 ENCOUNTER — Encounter: Payer: Self-pay | Admitting: Otolaryngology

## 2023-12-19 ENCOUNTER — Ambulatory Visit
Admission: RE | Admit: 2023-12-19 | Discharge: 2023-12-19 | Disposition: A | Discharge status: Home / Self Care | Payer: MEDICAID | Source: Ambulatory Visit | Attending: Otolaryngology | Admitting: Otolaryngology

## 2023-12-19 DIAGNOSIS — K115 Sialolithiasis: Secondary | ICD-10-CM

## 2023-12-19 MED ORDER — IOPAMIDOL (ISOVUE-300) INJECTION 61%
75.0000 mL | Freq: Once | INTRAVENOUS | Status: AC | PRN
  Administered 2023-12-19: 75 mL via INTRAVENOUS
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
## 2024-04-19 ENCOUNTER — Emergency Department
Admission: EM | Admit: 2024-04-19 | Discharge: 2024-04-19 | Disposition: A | Discharge status: Home / Self Care | Payer: MEDICAID

## 2024-04-19 ENCOUNTER — Emergency Department: Payer: MEDICAID

## 2024-04-19 ENCOUNTER — Other Ambulatory Visit: Payer: Self-pay

## 2024-04-19 DIAGNOSIS — J45909 Unspecified asthma, uncomplicated: Secondary | ICD-10-CM | POA: Insufficient documentation

## 2024-04-19 DIAGNOSIS — Z202 Contact with and (suspected) exposure to infections with a predominantly sexual mode of transmission: Secondary | ICD-10-CM | POA: Insufficient documentation

## 2024-04-19 DIAGNOSIS — R079 Chest pain, unspecified: Secondary | ICD-10-CM | POA: Insufficient documentation

## 2024-04-19 DIAGNOSIS — M25511 Pain in right shoulder: Secondary | ICD-10-CM | POA: Insufficient documentation

## 2024-04-19 DIAGNOSIS — M25512 Pain in left shoulder: Secondary | ICD-10-CM | POA: Insufficient documentation

## 2024-04-19 LAB — CBC
HCT: 46.6 % (ref 39.0–52.0)
Hemoglobin: 16 g/dL (ref 13.0–17.0)
MCH: 31.1 pg (ref 26.0–34.0)
MCHC: 34.3 g/dL (ref 30.0–36.0)
MCV: 90.7 fL (ref 80.0–100.0)
Platelets: 347 10*3/uL (ref 150–400)
RBC: 5.14 MIL/uL (ref 4.22–5.81)
RDW: 12.4 % (ref 11.5–15.5)
WBC: 11.9 10*3/uL — ABNORMAL HIGH (ref 4.0–10.5)
nRBC: 0 % (ref 0.0–0.2)

## 2024-04-19 LAB — BASIC METABOLIC PANEL WITH GFR
Anion gap: 14 (ref 5–15)
BUN: 5 mg/dL — ABNORMAL LOW (ref 6–20)
CO2: 22 mmol/L (ref 22–32)
Calcium: 9.4 mg/dL (ref 8.9–10.3)
Chloride: 103 mmol/L (ref 98–111)
Creatinine, Ser: 1.01 mg/dL (ref 0.61–1.24)
GFR, Estimated: >60 mL/min
Glucose, Bld: 91 mg/dL (ref 70–99)
Potassium: 4 mmol/L (ref 3.5–5.1)
Sodium: 139 mmol/L (ref 135–145)

## 2024-04-19 LAB — URINALYSIS, ROUTINE W REFLEX MICROSCOPIC
Bilirubin Urine: NEGATIVE
Glucose, UA: NEGATIVE mg/dL
Hgb urine dipstick: NEGATIVE
Ketones, ur: 5 mg/dL — AB
Leukocytes,Ua: NEGATIVE
Nitrite: NEGATIVE
Protein, ur: NEGATIVE mg/dL
Specific Gravity, Urine: 1.029 (ref 1.005–1.030)
pH: 5 (ref 5.0–8.0)

## 2024-04-19 LAB — CHLAMYDIA/NGC RT PCR (ARMC ONLY)
Chlamydia Tr: NOT DETECTED
N gonorrhoeae: NOT DETECTED

## 2024-04-19 LAB — TROPONIN T, HIGH SENSITIVITY: Troponin T High Sensitivity: 7 ng/L (ref 0–19)

## 2024-04-19 NOTE — Discharge Instructions (Signed)
 Workup today was reassuring and you are safe to return home.  Please do not pursue any sexual intercourse for at least a week.  Please check your results on MyChart in the next 48 hours.  Return with any acutely worsening symptoms or any other emergency. -- RETURN PRECAUTIONS & AFTERCARE: (ENGLISH) RETURN PRECAUTIONS: Return immediately to the emergency department or see/call your doctor if you feel worse, weak or have changes in speech or vision, are short of breath, have fever, vomiting, pain, bleeding or dark stool, trouble urinating or any new issues. Return here or see/call your doctor if not improving as expected for your suspected condition. FOLLOW-UP CARE: Call your doctor and/or any doctors we referred you to for more advice and to make an appointment. Do this today, tomorrow or after the weekend. Some doctors only take PPO insurance so if you have HMO insurance you may want to contact your HMO or your regular doctor for referral to a specialist within your plan. Either way tell the doctor's office that it was a referral from the emergency department so you get the soonest possible appointment.  YOUR TEST RESULTS: Take result reports of any blood or urine tests, imaging tests and EKG's to your doctor and any referral doctor. Have any abnormal tests repeated. Your doctor or a referral doctor can let you know when this should be done. Also make sure your doctor contacts this hospital to get any test results that are not currently available such as cultures or special tests for infection and final imaging reports, which are often not available at the time you leave the ER but which may list additional important findings that are not documented on the preliminary report. BLOOD PRESSURE: If your blood pressure was greater than 120/80 have your blood pressure rechecked within 1 to 2 weeks. MEDICATION SIDE EFFECTS: Do not drive, walk, bike, take the bus, etc. if you have received or are being prescribed any  sedating medications such as those for pain or anxiety or certain antihistamines like Benadryl. If you have been give one of these here get a taxi home or have a friend drive you home. Ask your pharmacist to counsel you on potential side effects of any new medication

## 2024-04-19 NOTE — ED Triage Notes (Signed)
 Pt comes in via pov with complaints of chest pain the past 4 days. Pt complains of pain in his chest and both shoulders. Pt complains of pain 5/10 at this time. Pt also has concerns of  having a possible STD, and is requesting testing. Pt denies SOB. Pt with no signs of acute distress at this time.
